# Patient Record
Sex: Male | Born: 1960 | Race: White | Hispanic: No | Marital: Married | Smoking: Never smoker
Health system: Southern US, Community
[De-identification: ages and names within clinical notes are randomized; demographics above are authoritative.]

## PROBLEM LIST (undated history)

## (undated) DIAGNOSIS — M810 Age-related osteoporosis without current pathological fracture: Secondary | ICD-10-CM

## (undated) DIAGNOSIS — L661 Lichen planopilaris, unspecified: Secondary | ICD-10-CM

## (undated) DIAGNOSIS — Q600 Renal agenesis, unilateral: Secondary | ICD-10-CM

## (undated) HISTORY — PX: KNEE ARTHROSCOPY: SUR90

## (undated) HISTORY — DX: Age-related osteoporosis without current pathological fracture: M81.0

## (undated) HISTORY — DX: Renal agenesis, unilateral: Q60.0

## (undated) HISTORY — PX: LIVER BIOPSY: SHX301

---

## 2005-04-01 ENCOUNTER — Ambulatory Visit (HOSPITAL_BASED_OUTPATIENT_CLINIC_OR_DEPARTMENT_OTHER): Admission: RE | Admit: 2005-04-01 | Discharge: 2005-04-01 | Payer: Self-pay | Admitting: Orthopedic Surgery

## 2007-01-31 ENCOUNTER — Ambulatory Visit (HOSPITAL_COMMUNITY): Admission: RE | Admit: 2007-01-31 | Discharge: 2007-02-01 | Payer: Self-pay

## 2007-01-31 ENCOUNTER — Encounter (INDEPENDENT_AMBULATORY_CARE_PROVIDER_SITE_OTHER): Payer: Self-pay | Admitting: Interventional Radiology

## 2010-06-04 NOTE — Op Note (Signed)
NAMEMASON, DIBIASIO            ACCOUNT NO.:  [id number]   MEDICAL RECORD NO.:  [id number]          PATIENT TYPE:  AMB   LOCATION:  DSC                          FACILITY:  [facility]   PHYSICIAN:  Harvie Junior, M.D.   DATE OF BIRTH:  Jun 10, 1960   DATE OF PROCEDURE:  04/01/2005  DATE OF DISCHARGE:  04/01/2005                                 OPERATIVE REPORT   PREOPERATIVE DIAGNOSIS:  Medial meniscal tear.   POSTOPERATIVE DIAGNOSIS:  1.  Medial meniscal tear.  2.  Chondromalacia patella.   PRINCIPAL PROCEDURES:  1.  Debridement of posterior horn medial meniscal tear.  2.  Debridement of chondromalacia patella.   SURGEON:  Harvie Junior, M.D.   ASSISTANT:  Marshia Ly, P.A.   ANESTHESIA:  General anesthesia.   BRIEF HISTORY:  Wahid Holley is a 50 year old male with a long history of  having had a left knee injury where he ultimately underwent arthroscopic  evaluation of his knee.  He had an excellent result from that and began  having symptoms in his right knee of pain.  We ultimately tried an injection  which helped for a short period of time but because of continued complaints  of pain, he was also taken to the operating room for operative knee  arthroscopy.   DESCRIPTION OF PROCEDURE:  Patient was taken to the operating room after  adequate anesthesia was obtained with general anesthetic, the patient was  placed supine on the operating table.  The right leg was prepped and draped  in usual sterile fashion.  Following this, routine arthroscopic examination  of the knee revealed that there was an obvious posterior horn medial  meniscal tear.  This was debrided with a combination of straight biting  forceps, up-biting forceps, right-hand side-biting forceps.  Remaining  meniscal remnants contoured down with suction shaver.  Attention was then  turned up to the patellofemoral joint where there was noted to be some grade  II chondromalacia which was debrided.  Following this,  attention was turned  into the notch where the ACL was normal.  Laterally, the lateral compartment  was normal.  The medial femoral condyle showed no significant evidence of  wear.  At this point,the knee was copiously irrigated and suctioned dry.  Final check was made for loose fragments and pieces,  seeing none.  The portals were closed with a bandage.  Sterile compressive  dressing was applied and the patient taken to the recovery room where she  was noted to be in satisfactory condition.   ESTIMATED BLOOD LOSS:  None.      Harvie Junior, M.D.  Electronically Signed     JLG/MEDQ  D:  05/12/2005  T:  05/13/2005  Job:  [id number]

## 2010-07-02 ENCOUNTER — Other Ambulatory Visit: Payer: Self-pay | Admitting: Gastroenterology

## 2010-10-07 LAB — CBC
HCT: 38.5 — ABNORMAL LOW
HCT: 40.5
Hemoglobin: 13.7
Hemoglobin: 14.2
MCHC: 35.1
MCV: 86.3
MCV: 86.4
RBC: 4.46
RBC: 4.69
WBC: 9.1
WBC: 9.3

## 2011-07-01 ENCOUNTER — Encounter (HOSPITAL_COMMUNITY): Payer: Self-pay

## 2011-07-01 ENCOUNTER — Emergency Department (HOSPITAL_COMMUNITY): Payer: BC Managed Care – PPO

## 2011-07-01 ENCOUNTER — Emergency Department (HOSPITAL_COMMUNITY)
Admission: EM | Admit: 2011-07-01 | Discharge: 2011-07-01 | Disposition: A | Discharge status: Home / Self Care | Payer: BC Managed Care – PPO | Attending: Emergency Medicine | Admitting: Emergency Medicine

## 2011-07-01 DIAGNOSIS — Q602 Renal agenesis, unspecified: Secondary | ICD-10-CM | POA: Insufficient documentation

## 2011-07-01 DIAGNOSIS — L439 Lichen planus, unspecified: Secondary | ICD-10-CM | POA: Insufficient documentation

## 2011-07-01 DIAGNOSIS — Q600 Renal agenesis, unilateral: Secondary | ICD-10-CM

## 2011-07-01 DIAGNOSIS — N281 Cyst of kidney, acquired: Secondary | ICD-10-CM | POA: Insufficient documentation

## 2011-07-01 DIAGNOSIS — Q605 Renal hypoplasia, unspecified: Secondary | ICD-10-CM | POA: Insufficient documentation

## 2011-07-01 DIAGNOSIS — Z79899 Other long term (current) drug therapy: Secondary | ICD-10-CM | POA: Insufficient documentation

## 2011-07-01 DIAGNOSIS — R109 Unspecified abdominal pain: Secondary | ICD-10-CM | POA: Insufficient documentation

## 2011-07-01 HISTORY — DX: Lichen planopilaris: L66.1

## 2011-07-01 HISTORY — DX: Lichen planopilaris, unspecified: L66.10

## 2011-07-01 LAB — POCT I-STAT, CHEM 8
Calcium, Ion: 1.15 mmol/L (ref 1.12–1.32)
Chloride: 103 mEq/L (ref 96–112)
Creatinine, Ser: 1 mg/dL (ref 0.50–1.35)
Glucose, Bld: 92 mg/dL (ref 70–99)
Hemoglobin: 13.9 g/dL (ref 13.0–17.0)
Potassium: 3.8 mEq/L (ref 3.5–5.1)

## 2011-07-01 LAB — DIFFERENTIAL
Basophils Relative: 1 % (ref 0–1)
Eosinophils Absolute: 0.1 10*3/uL (ref 0.0–0.7)
Lymphocytes Relative: 33 % (ref 12–46)
Lymphs Abs: 1.6 10*3/uL (ref 0.7–4.0)
Neutro Abs: 2.7 10*3/uL (ref 1.7–7.7)

## 2011-07-01 LAB — URINALYSIS, ROUTINE W REFLEX MICROSCOPIC
Bilirubin Urine: NEGATIVE
Glucose, UA: NEGATIVE mg/dL
Hgb urine dipstick: NEGATIVE
Ketones, ur: NEGATIVE mg/dL
Leukocytes, UA: NEGATIVE
pH: 6.5 (ref 5.0–8.0)

## 2011-07-01 LAB — CBC
HCT: 41.4 % (ref 39.0–52.0)
Hemoglobin: 14.1 g/dL (ref 13.0–17.0)
MCHC: 34.1 g/dL (ref 30.0–36.0)
MCV: 84.5 fL (ref 78.0–100.0)

## 2011-07-01 MED ORDER — ONDANSETRON 8 MG PO TBDP
8.0000 mg | ORAL_TABLET | Freq: Three times a day (TID) | ORAL | Status: AC | PRN
Start: 2011-07-01 — End: 2011-07-08

## 2011-07-01 MED ORDER — TRAMADOL HCL 50 MG PO TABS: 50.0000 mg | ORAL_TABLET | Freq: Four times a day (QID) | ORAL | Status: AC | PRN

## 2011-07-01 NOTE — ED Notes (Signed)
Patient reports that he has had intermittaent left flank pain since Wednesday and has gotten progressively worse. Patient denies dysuria, urinary frequency or hesitancy, blood in urine,  N/V, or fever.

## 2011-07-01 NOTE — ED Notes (Signed)
Pt c/o upper L flank pain, worse since yesterday. Unable to get comfortable. Movement makes pain worse. Denies n/v, dysuria, hematuria.

## 2011-07-01 NOTE — ED Provider Notes (Signed)
History     CSN: [id number]  Arrival date & time 07/01/11  1209   First MD Initiated Contact with Patient 07/01/11 1248      Chief Complaint  Patient presents with  . Flank Pain    (Consider location/radiation/quality/duration/timing/severity/associated sxs/prior treatment) Patient is a 51 y.o. male presenting with flank pain. The history is provided by the patient.  Flank Pain This is a new problem. Pertinent negatives include no chest pain, no abdominal pain, no headaches and no shortness of breath.   patient has had intermittent left flank pain since Wednesday. Been progressively worse. No dysuria. No frequency. No hesitancy no blood in urine. He states he developed some nausea since and the pain got severe last night. Not worse with movement. He stated that sometimes feel worse she sits wrong. No vomiting or diarrhea. No fevers. He's not had pains like this before. No rash.  Past Medical History  Diagnosis Date  . Lichen plano-pilaris     Past Surgical History  Procedure Date  . Knee arthroscopy   . Liver biopsy     Family History  Problem Relation Age of Onset  . Hypertension Mother     History  Substance Use Topics  . Smoking status: Never Smoker   . Smokeless tobacco: Never Used  . Alcohol Use: Yes     socially      Review of Systems  Constitutional: Negative for activity change and appetite change.  HENT: Negative for neck stiffness.   Eyes: Negative for pain.  Respiratory: Negative for chest tightness and shortness of breath.   Cardiovascular: Negative for chest pain and leg swelling.  Gastrointestinal: Negative for nausea, vomiting, abdominal pain and diarrhea.  Genitourinary: Positive for flank pain.  Musculoskeletal: Positive for back pain.  Skin: Negative for rash.  Neurological: Negative for weakness, numbness and headaches.  Psychiatric/Behavioral: Negative for behavioral problems.    Allergies  Review of patient's allergies indicates no  known allergies.  Home Medications   Current Outpatient Rx  Name Route Sig Dispense Refill  . IBUPROFEN 200 MG PO TABS Oral Take 400 mg by mouth every 6 (six) hours as needed. pain    . METHOTREXATE SODIUM 2.5 MG PO TABS Oral Take 15 mg by mouth once a week. Pt takes 6 tabs for 15mg  dose once weekly on Wednesday.    Marland Kitchen ONDANSETRON 8 MG PO TBDP Oral Take 1 tablet (8 mg total) by mouth every 8 (eight) hours as needed for nausea. 10 tablet 0  . TRAMADOL HCL 50 MG PO TABS Oral Take 1 tablet (50 mg total) by mouth every 6 (six) hours as needed for pain. 10 tablet 0    BP 121/79  Pulse 59  Temp 97.9 F (36.6 C) (Oral)  Resp 12  SpO2 100%  Physical Exam  Nursing note and vitals reviewed. Constitutional: He is oriented to person, place, and time. He appears well-developed and well-nourished.  HENT:  Head: Normocephalic and atraumatic.  Eyes: EOM are normal. Pupils are equal, round, and reactive to light.  Neck: Normal range of motion. Neck supple.  Cardiovascular: Normal rate, regular rhythm and normal heart sounds.   No murmur heard. Pulmonary/Chest: Effort normal and breath sounds normal.  Abdominal: Soft. Bowel sounds are normal. He exhibits no distension and no mass. There is no tenderness. There is no rebound and no guarding.  Genitourinary:       Tenderness to the left CVA area. No rash.  Musculoskeletal: Normal range of motion. He exhibits  no edema.  Neurological: He is alert and oriented to person, place, and time. No cranial nerve deficit.  Skin: Skin is warm and dry.  Psychiatric: He has a normal mood and affect.    ED Course  Procedures (including critical care time)   Labs Reviewed  URINALYSIS, ROUTINE W REFLEX MICROSCOPIC  CBC  DIFFERENTIAL  POCT I-STAT, CHEM 8   Ct Abdomen Pelvis Wo Contrast  07/01/2011  *RADIOLOGY REPORT*  Clinical Data: Left flank pain with nausea for 2 days. No reported prior relevant surgery.  CT ABDOMEN AND PELVIS WITHOUT CONTRAST   Technique:  Multidetector CT imaging of the abdomen and pelvis was performed following the standard protocol without intravenous contrast.  Comparison: None.  Findings: The lung bases are clear.  There is no pleural effusion. No right kidney is demonstrated in its expected anatomic location or in the pelvis.  The left kidney demonstrates probable compensatory hypertrophy, measuring approximately 14.5 cm in length.  There is a 6.5 x 8.7 cm cyst in the upper pole of the left kidney which does not show any suspicious features on noncontrast imaging.  There is no hydronephrosis or perinephric soft tissue stranding.  Mild prominence of the left renal pelvis and ureter is noted.  There is no evidence of renal, ureteral or bladder calculus.  There is a 1.6 cm low density lesion posteriorly in the right hepatic lobe on image number 19.  The liver otherwise appears normal.  The spleen, gallbladder and pancreas appear normal.  Both adrenal glands are visualized and appear normal.  The bowel gas pattern appears normal.  There is stool throughout the colon. No inflammatory changes are seen.  There is a small right pelvic phlebolith.  The prostate gland and seminal vesicles appear normal.  There is a 2.7 cm fluid collection anterior to the right femoral head consistent with iliopsoas bursitis.  No acute osseous findings are seen.  IMPRESSION:  1.  No evidence of urinary tract calculus or hydronephrosis. 2.  Solitary left kidney demonstrates compensatory hypertrophy and a large cyst in its upper pole.  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 Both adrenal glands are present. 3.  Small indeterminate liver lesion. 4.  Right iliopsoas bursitis.  Original Report Authenticated By: Gerrianne Scale, M.D.     1. Renal cyst   2. Solitary kidney, congenital       MDM  Patient with left flank pain. No urinary symptoms. No trauma. Lab work and urine reassuring. Noncontrast CT was  done and showed a solitary kidney with a cyst. Patient was discharged home with pain medications and urology followup.        Juliet Rude. Rubin Payor, MD 07/01/11 [phone]

## 2011-07-01 NOTE — Discharge Instructions (Signed)
You have only 1 kidney and it has a cyst on it. This is the likely cause of your pain. Watch for fevers or change in urinary output. Follow with Urology.

## 2011-07-03 ENCOUNTER — Encounter (HOSPITAL_COMMUNITY): Payer: Self-pay | Admitting: Emergency Medicine

## 2011-07-03 ENCOUNTER — Emergency Department (HOSPITAL_COMMUNITY)
Admission: EM | Admit: 2011-07-03 | Discharge: 2011-07-03 | Disposition: A | Discharge status: Home / Self Care | Payer: BC Managed Care – PPO | Attending: Emergency Medicine | Admitting: Emergency Medicine

## 2011-07-03 DIAGNOSIS — B029 Zoster without complications: Secondary | ICD-10-CM | POA: Insufficient documentation

## 2011-07-03 DIAGNOSIS — R110 Nausea: Secondary | ICD-10-CM | POA: Insufficient documentation

## 2011-07-03 DIAGNOSIS — Q619 Cystic kidney disease, unspecified: Secondary | ICD-10-CM | POA: Insufficient documentation

## 2011-07-03 MED ORDER — PREDNISONE 20 MG PO TABS
60.0000 mg | ORAL_TABLET | Freq: Once | ORAL | Status: AC
  Administered 2011-07-03: 60 mg via ORAL
  Filled 2011-07-03: qty 3

## 2011-07-03 MED ORDER — OXYCODONE-ACETAMINOPHEN 5-325 MG PO TABS: 1.0000 | ORAL_TABLET | Freq: Four times a day (QID) | ORAL | Status: AC | PRN

## 2011-07-03 MED ORDER — ONDANSETRON 8 MG PO TBDP
8.0000 mg | ORAL_TABLET | Freq: Once | ORAL | Status: AC
  Administered 2011-07-03: 8 mg via ORAL
  Filled 2011-07-03: qty 2
  Filled 2011-07-03: qty 1

## 2011-07-03 MED ORDER — ONDANSETRON HCL 8 MG PO TABS: 8.0000 mg | ORAL_TABLET | Freq: Three times a day (TID) | ORAL | Status: AC | PRN

## 2011-07-03 MED ORDER — PREDNISONE 20 MG PO TABS: ORAL_TABLET | ORAL | Status: AC

## 2011-07-03 MED ORDER — OXYCODONE-ACETAMINOPHEN 5-325 MG PO TABS
2.0000 | ORAL_TABLET | Freq: Once | ORAL | Status: AC
  Administered 2011-07-03: 2 via ORAL
  Filled 2011-07-03: qty 2

## 2011-07-03 MED ORDER — ACYCLOVIR 800 MG PO TABS: 800.0000 mg | ORAL_TABLET | Freq: Every day | ORAL | Status: AC

## 2011-07-03 NOTE — ED Notes (Signed)
Spouse and patient voiced understanding of instructions given

## 2011-07-03 NOTE — Discharge Instructions (Signed)
Take prednisone and acyclovir as prescribed. You may take percocet as need for pain. No driving for the next 6 hours or when taking percocet. Also, do not take tylenol or acetaminophen containing medication when taking percocet.  You may take zofran prior to taking percocet to help prevent nausea if you find that the percocet causes nausea.  Return to ER if worse, new symptoms, other concern.  For recent finding of kidney cyst, follow up with urologist as planned.        Shingles Shingles is caused by the same virus that causes chickenpox (varicella zoster virus or VZV). Shingles often occurs many years or decades after having chickenpox. That is why it is more common in adults older than 50 years. The virus reactivates and breaks out as an infection in a nerve root. SYMPTOMS   The initial feeling (sensations) may be pain. This pain is usually described as:   Burning.   Stabbing.   Throbbing.   Tingling in the nerve root.   A red rash will follow in a couple days. The rash may occur in any area of the body and is usually on one side (unilateral) of the body in a band or belt-like pattern. The rash usually starts out as very small blisters (vesicles). They will dry up after 7 to 10 days. This is not usually a significant problem except for the pain it causes.   Long-lasting (chronic) pain is more likely in an elderly person. It can last months to years. This condition is called postherpetic neuralgia.  Shingles can be an extremely severe infection in someone with AIDS, a weakened immune system, or with forms of leukemia. It can also be severe if you are taking transplant medicines or other medicines that weaken the immune system. TREATMENT  Your caregiver will often treat you with:  Antiviral drugs.   Anti-inflammatory drugs.   Pain medicines.  Bed rest is very important in preventing the pain associated with herpes zoster (postherpetic neuralgia). Application of heat in the form  of a hot water bottle or electric heating pad or gentle pressure with the hand is recommended to help with the pain or discomfort. PREVENTION  A varicella zoster vaccine is available to help protect against the virus. The Food and Drug Administration approved the varicella zoster vaccine for individuals 46 years of age and older. HOME CARE INSTRUCTIONS   Cool compresses to the area of rash may be helpful.   Only take over-the-counter or prescription medicines for pain, discomfort, or fever as directed by your caregiver.   Avoid contact with:   Babies.   Pregnant women.   Children with eczema.   Elderly people with transplants.   People with chronic illnesses, such as leukemia and AIDS.   If the area involved is on your face, you may receive a referral for follow-up to a specialist. It is very important to keep all follow-up appointments. This will help avoid eye complications, chronic pain, or disability.  SEEK IMMEDIATE MEDICAL CARE IF:   You develop any pain (headache) in the area of the face or eye. This must be followed carefully by your caregiver or ophthalmologist. An infection in part of your eye (cornea) can be very serious. It could lead to blindness.   You do not have pain relief from prescribed medicines.   Your redness or swelling spreads.   The area involved becomes very swollen and painful.   You have a fever.   You notice any red or painful  lines extending away from the affected area toward your heart (lymphangitis).   Your condition is worsening or has changed.  Document Released: 01/03/2005 Document Revised: 12/23/2010 Document Reviewed: 12/08/2008 [facility] Patient Information 2012 [place], [place].

## 2011-07-03 NOTE — ED Provider Notes (Signed)
History     CSN: [id number]  Arrival date & time 07/03/11  4098   First MD Initiated Contact with Patient 07/03/11 [phone]      Chief Complaint  Patient presents with  . Flank Pain    (Consider location/radiation/quality/duration/timing/severity/associated sxs/prior treatment) Patient is a 51 y.o. male presenting with flank pain. The history is provided by the patient and the spouse.  Flank Pain  pt c/o pain to left flank for past few days. Constant, dull/burning. Non radiating. No specific exacerbating or alleviating factors. Nausea. No vomiting. No gu c/o. No hematuria. No fever or chills. Was recently seen in ed for same, had ct, was told has renal cyst.   Past Medical History  Diagnosis Date  . Lichen plano-pilaris     Past Surgical History  Procedure Date  . Knee arthroscopy   . Liver biopsy     Family History  Problem Relation Age of Onset  . Hypertension Mother     History  Substance Use Topics  . Smoking status: Never Smoker   . Smokeless tobacco: Never Used  . Alcohol Use: Yes     socially      Review of Systems  Constitutional: Negative for fever.  Gastrointestinal: Positive for nausea.  Genitourinary: Positive for flank pain. Negative for dysuria and hematuria.    Allergies  Review of patient's allergies indicates no known allergies.  Home Medications   Current Outpatient Rx  Name Route Sig Dispense Refill  . IBUPROFEN 200 MG PO TABS Oral Take 400 mg by mouth every 6 (six) hours as needed. pain    . METHOTREXATE SODIUM 2.5 MG PO TABS Oral Take 15 mg by mouth once a week. Pt takes 6 tabs for 15mg  dose once weekly on Wednesday.    Marland Kitchen ONDANSETRON 8 MG PO TBDP Oral Take 1 tablet (8 mg total) by mouth every 8 (eight) hours as needed for nausea. 10 tablet 0  . TRAMADOL HCL 50 MG PO TABS Oral Take 1 tablet (50 mg total) by mouth every 6 (six) hours as needed for pain. 10 tablet 0    BP 118/79  Pulse 63  Temp 98.2 F (36.8 C) (Oral)  Resp 18  SpO2  94%  Physical Exam  Nursing note and vitals reviewed. Constitutional: He is oriented to person, place, and time. He appears well-developed and well-nourished. No distress.  HENT:  Head: Atraumatic.  Eyes: Conjunctivae are normal.  Neck: Neck supple. No tracheal deviation present.  Cardiovascular: Normal rate.   Pulmonary/Chest: Effort normal. No accessory muscle usage. No respiratory distress.  Abdominal: Soft. Bowel sounds are normal. He exhibits no distension. There is no tenderness.  Genitourinary:       No cva tenderness  Musculoskeletal: He exhibits no edema.  Neurological: He is alert and oriented to person, place, and time.  Skin: Skin is warm and dry. Rash noted.       Pt with erythematous vesicular rash following single dermatome left flank in area of his pain. No cellulitis.   Psychiatric: He has a normal mood and affect.    ED Course  Procedures (including critical care time)  Labs Reviewed - No data to display Ct Abdomen Pelvis Wo Contrast  07/01/2011  *RADIOLOGY REPORT*  Clinical Data: Left flank pain with nausea for 2 days. No reported prior relevant surgery.  CT ABDOMEN AND PELVIS WITHOUT CONTRAST  Technique:  Multidetector CT imaging of the abdomen and pelvis was performed following the standard protocol without intravenous contrast.  Comparison:  None.  Findings: The lung bases are clear.  There is no pleural effusion. No right kidney is demonstrated in its expected anatomic location or in the pelvis.  The left kidney demonstrates probable compensatory hypertrophy, measuring approximately 14.5 cm in length.  There is a 6.5 x 8.7 cm cyst in the upper pole of the left kidney which does not show any suspicious features on noncontrast imaging.  There is no hydronephrosis or perinephric soft tissue stranding.  Mild prominence of the left renal pelvis and ureter is noted.  There is no evidence of renal, ureteral or bladder calculus.  There is a 1.6 cm low density lesion  posteriorly in the right hepatic lobe on image number 19.  The liver otherwise appears normal.  The spleen, gallbladder and pancreas appear normal.  Both adrenal glands are visualized and appear normal.  The bowel gas pattern appears normal.  There is stool throughout the colon. No inflammatory changes are seen.  There is a small right pelvic phlebolith.  The prostate gland and seminal vesicles appear normal.  There is a 2.7 cm fluid collection anterior to the right femoral head consistent with iliopsoas bursitis.  No acute osseous findings are seen.  IMPRESSION:  1.  No evidence of urinary tract calculus or hydronephrosis. 2.  Solitary left kidney demonstrates compensatory hypertrophy and a large cyst in its upper pole.  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 Both adrenal glands are present. 3.  Small indeterminate liver lesion. 4.  Right iliopsoas bursitis.  Original Report Authenticated By: Gerrianne Scale, M.D.        MDM  Pt has ride, does not have to drive.  Percocet po. zofran po (pt notes nausea w pain med in past).   Discussed dx/shingles w pt.  Feel likely that recent dx renal cyst was an incidental finding unrelated to pts acute pain - for that issue pt has been referred to urology follow up.         Suzi Roots, MD 07/03/11 [phone]

## 2011-07-03 NOTE — ED Notes (Signed)
Has noticed a rash that starts posterior midline and moves laterally to the left flank to his abdomen. Patient states that his pain is consistently there in that area and is more painful to the touch. Spouse stated that she noticed the rash when patient was getting undressed here in the ER this am

## 2011-07-03 NOTE — ED Notes (Signed)
Pt c/o left flank pain. States he was seen here on Thursday and dx with cyst on kidney. Pt has appt tomorrow but was unable to wait until then d/t pain.

## 2014-10-07 ENCOUNTER — Other Ambulatory Visit: Payer: Self-pay | Admitting: Surgery

## 2015-05-08 ENCOUNTER — Encounter (HOSPITAL_COMMUNITY): Payer: Self-pay | Admitting: Emergency Medicine

## 2015-05-08 ENCOUNTER — Emergency Department (HOSPITAL_COMMUNITY): Payer: BLUE CROSS/BLUE SHIELD

## 2015-05-08 ENCOUNTER — Emergency Department (HOSPITAL_COMMUNITY)
Admission: EM | Admit: 2015-05-08 | Discharge: 2015-05-08 | Disposition: A | Discharge status: Home / Self Care | Payer: BLUE CROSS/BLUE SHIELD | Attending: Emergency Medicine | Admitting: Emergency Medicine

## 2015-05-08 DIAGNOSIS — Z79899 Other long term (current) drug therapy: Secondary | ICD-10-CM | POA: Insufficient documentation

## 2015-05-08 DIAGNOSIS — Y92009 Unspecified place in unspecified non-institutional (private) residence as the place of occurrence of the external cause: Secondary | ICD-10-CM | POA: Insufficient documentation

## 2015-05-08 DIAGNOSIS — Y9301 Activity, walking, marching and hiking: Secondary | ICD-10-CM | POA: Diagnosis not present

## 2015-05-08 DIAGNOSIS — W2203XA Walked into furniture, initial encounter: Secondary | ICD-10-CM | POA: Insufficient documentation

## 2015-05-08 DIAGNOSIS — Y998 Other external cause status: Secondary | ICD-10-CM | POA: Insufficient documentation

## 2015-05-08 DIAGNOSIS — Z872 Personal history of diseases of the skin and subcutaneous tissue: Secondary | ICD-10-CM | POA: Diagnosis not present

## 2015-05-08 DIAGNOSIS — S99922A Unspecified injury of left foot, initial encounter: Secondary | ICD-10-CM | POA: Diagnosis present

## 2015-05-08 DIAGNOSIS — S92512A Displaced fracture of proximal phalanx of left lesser toe(s), initial encounter for closed fracture: Secondary | ICD-10-CM | POA: Insufficient documentation

## 2015-05-08 DIAGNOSIS — S92912A Unspecified fracture of left toe(s), initial encounter for closed fracture: Secondary | ICD-10-CM

## 2015-05-08 MED ORDER — NAPROXEN 500 MG PO TABS: ORAL_TABLET | ORAL | Status: DC

## 2015-05-08 MED ORDER — LIDOCAINE HCL 2 % IJ SOLN
5.0000 mL | Freq: Once | INTRAMUSCULAR | Status: DC
  Filled 2015-05-08: qty 20

## 2015-05-08 MED ORDER — HYDROCODONE-ACETAMINOPHEN 5-325 MG PO TABS: 1.0000 | ORAL_TABLET | ORAL | Status: DC | PRN

## 2015-05-08 NOTE — ED Notes (Signed)
Pt states that he was coming out of the bathroom and caught the chair with his left 5th toe; pt c/o pain to left 5th toe and foot; mild swelling noted to bottom of left foot

## 2015-05-08 NOTE — Discharge Instructions (Signed)
Toe Fracture °A toe fracture is a break in one of the toe bones (phalanges). °CAUSES °This condition may be caused by: °· Dropping a heavy object on your toe. °· Stubbing your toe. °· Overusing your toe or doing repetitive exercise. °· Twisting or stretching your toe out of place. °RISK FACTORS °This condition is more likely to develop in people who: °· Play contact sports. °· Have a bone disease. °· Have a low calcium level. °SYMPTOMS °The main symptoms of this condition are swelling and pain in the toe. The pain may get worse with standing or walking. Other symptoms include: °· Bruising. °· Stiffness. °· Numbness. °· A change in the way the toe looks. °· Broken bones that poke through the skin. °· Blood beneath the toenail. °DIAGNOSIS °This condition is diagnosed with a physical exam. You may also have X-rays. °TREATMENT  °Treatment for this condition depends on the type of fracture and its severity. Treatment may involve: °· Taping the broken toe to a toe that is next to it (buddy taping). This is the most common treatment for fractures in which the bone has not moved out of place (nondisplaced fracture). °· Wearing a shoe that has a wide, rigid sole to protect the toe and to limit its movement. °· Wearing a walking cast. °· Having a procedure to move the toe back into place. °· Surgery. This may be needed: °¨ If there are many pieces of broken bone that are out of place (displaced). °¨ If the toe joint breaks. °¨ If the bone breaks through the skin. °· Physical therapy. This is done to help regain movement and strength in the toe. °You may need follow-up X-rays to make sure that the bone is healing well and staying in position. °HOME CARE INSTRUCTIONS °If You Have a Cast: °· Do not stick anything inside the cast to scratch your skin. Doing that increases your risk of infection. °· Check the skin around the cast every day. Report any concerns to your health care provider. You may put lotion on dry skin around the  edges of the cast. Do not apply lotion to the skin underneath the cast. °· Do not put pressure on any part of the cast until it is fully hardened. This may take several hours. °· Keep the cast clean and dry. °Bathing °· Do not take baths, swim, or use a hot tub until your health care provider approves. Ask your health care provider if you can take showers. You may only be allowed to take sponge baths for bathing. °· If your health care provider approves bathing and showering, cover the cast or bandage (dressing) with a watertight plastic bag to protect it from water. Do not let the cast or dressing get wet. °Managing Pain, Stiffness, and Swelling °· If you do not have a cast, apply ice to the injured area, if directed. °¨ Put ice in a plastic bag. °¨ Place a towel between your skin and the bag. °¨ Leave the ice on for 20 minutes, 2-3 times per day. °· Move your toes often to avoid stiffness and to lessen swelling. °· Raise (elevate) the injured area above the level of your heart while you are sitting or lying down. °Driving °· Do not drive or operate heavy machinery while taking pain medicine. °· Do not drive while wearing a cast on a foot that you use for driving. °Activity °· Return to your normal activities as directed by your health care provider. Ask your health care   provider what activities are safe for you. °· Perform exercises daily as directed by your health care provider or physical therapist. °Safety °· Do not use the injured limb to support your body weight until your health care provider says that you can. Use crutches or other assistive devices as directed by your health care provider. °General Instructions °· If your toe was treated with buddy taping, follow your health care provider's instructions for changing the gauze and tape. Change it more often: °¨ The gauze and tape get wet. If this happens, dry the space between the toes. °¨ The gauze and tape are too tight and cause your toe to become pale  or numb. °· Wear a protective shoe as directed by your health care provider. If you were not given a protective shoe, wear sturdy, supportive shoes. Your shoes should not pinch your toes and should not fit tightly against your toes. °· Do not use any tobacco products, including cigarettes, chewing tobacco, or e-cigarettes. Tobacco can delay bone healing. If you need help quitting, ask your health care provider. °· Take medicines only as directed by your health care provider. °· Keep all follow-up visits as directed by your health care provider. This is important. °SEEK MEDICAL CARE IF: °· You have a fever. °· Your pain medicine is not helping. °· Your toe is cold. °· Your toe is numb. °· You still have pain after one week of rest and treatment. °· You still have pain after your health care provider has said that you can start walking again. °· You have pain, tingling, or numbness in your foot that is not going away. °SEEK IMMEDIATE MEDICAL CARE IF: °· You have severe pain. °· You have redness or inflammation in your toe that is getting worse. °· You have pain or numbness in your toe that is getting worse. °· Your toe turns blue. °  °This information is not intended to replace advice given to you by your health care provider. Make sure you discuss any questions you have with your health care provider. °  °Document Released: 01/01/2000 Document Revised: 09/24/2014 Document Reviewed: 10/30/2013 °Elsevier Interactive Patient Education ©2016 [employer] ° °

## 2015-05-08 NOTE — ED Notes (Signed)
Nerve block to left 5th toe by Elpidio AnisShari Upstill, PA; toe positioned back in place and buddy wrapped to 4th toe; Cam Walker placed

## 2015-05-08 NOTE — ED Provider Notes (Signed)
CSN: [id number]     Arrival date & time 05/08/15  [id number] History   First MD Initiated Contact with Patient 05/08/15 0355     Chief Complaint  Patient presents with  . Foot Injury     (Consider location/radiation/quality/duration/timing/severity/associated sxs/prior Treatment) Patient is a 55 y.o. male presenting with foot injury. The history is provided by the patient. No language interpreter was used.  Foot Injury Location:  Foot Injury: yes   Foot location:  L foot Chronicity:  New Dislocation: yes   Foreign body present:  No foreign bodies Prior injury to area:  Yes Associated symptoms comment:  Patient presents with left foot pain and 5th toe deformity after running into furniture while walking through the house tonight. No other injury.    Past Medical History  Diagnosis Date  . Lichen plano-pilaris    Past Surgical History  Procedure Laterality Date  . Knee arthroscopy    . Liver biopsy     Family History  Problem Relation Age of Onset  . Hypertension Mother    Social History  Substance Use Topics  . Smoking status: Never Smoker   . Smokeless tobacco: Never Used  . Alcohol Use: Yes     Comment: socially    Review of Systems  Musculoskeletal:       See HPI.  Skin: Negative for wound.  Neurological: Negative for numbness.      Allergies  Review of patient's allergies indicates no known allergies.  Home Medications   Prior to Admission medications   Medication Sig Start Date End Date Taking? Authorizing Provider  folic acid (FOLVITE) 1 MG tablet Take 1 mg by mouth daily. 04/07/15  Yes Historical Provider, MD  ibuprofen (ADVIL,MOTRIN) 200 MG tablet Take 400 mg by mouth every 6 (six) hours as needed. pain   Yes Historical Provider, MD  methotrexate 2.5 MG tablet Take 15 mg by mouth once a week. Pt takes 6 tabs for 15mg  dose once weekly on Wednesday.   Yes Historical Provider, MD   BP 117/88 mmHg  Pulse 74  Temp(Src) 97.6 F (36.4 C) (Oral)  Resp 20   Ht 6\' 1"  (1.854 m)  Wt 77.111 kg  BMI 22.43 kg/m2  SpO2 99% Physical Exam  Constitutional: He appears well-developed and well-nourished. No distress.  Pulmonary/Chest: Effort normal.  Musculoskeletal:  Left 5th toe lateral deviation/deformity. Minimal swelling. No discoloration. No MT tenderness.   Neurological: He is alert. He has normal reflexes.  Skin: Skin is warm and dry.    ED Course  Procedures (including critical care time) Labs Review Labs Reviewed - No data to display  Imaging Review Dg Foot Complete Left  05/08/2015  CLINICAL DATA:  Pain and deformity to the fifth toe after injury. EXAM: LEFT FOOT - COMPLETE 3+ VIEW COMPARISON:  None. FINDINGS: Transverse fracture of the proximal aspect proximal phalanx left fifth toe with lateral angulation of the distal fracture fragment. Mild impaction. Fracture line does not appear to extend to the articular surface. Hammertoe deformity of the fourth toe. IMPRESSION: Fracture of the proximal phalanx left fifth toe. Electronically Signed   By: Burman NievesWilliam  Stevens M.D.   On: 05/08/2015 04:09   I have personally reviewed and evaluated these images and lab results as part of my medical decision-making.   EKG Interpretation None     PROCEDURE: Left 5th toe numbed via digital block using 2% lidocaine w/o epi with good anesthesia. Toe reduced by traction, realignment, to anatomical position. Toe buddy taped to 4th  toe for stabilization. MDM   Final diagnoses:  None    1. Left 5th toe fracture.  Left 5th toe fraction dislocation, reduced to anatomical alignment. Stable for discharge home.   Elpidio Anis, PA-C 05/08/15 0443  Dione Booze, MD 05/08/15 [phone]

## 2015-05-08 NOTE — ED Notes (Signed)
Pt states he was walking in the dark and kicked into a piece of furniture with his left foot  Pt has deformity noted to his 4th and 5th toe

## 2018-02-01 ENCOUNTER — Other Ambulatory Visit: Payer: Self-pay | Admitting: Internal Medicine

## 2018-02-01 DIAGNOSIS — Z7952 Long term (current) use of systemic steroids: Secondary | ICD-10-CM

## 2018-03-29 ENCOUNTER — Other Ambulatory Visit: Payer: BLUE CROSS/BLUE SHIELD

## 2018-11-06 ENCOUNTER — Other Ambulatory Visit: Payer: Self-pay

## 2018-11-06 DIAGNOSIS — Z20822 Contact with and (suspected) exposure to covid-19: Secondary | ICD-10-CM

## 2018-11-07 LAB — NOVEL CORONAVIRUS, NAA: SARS-CoV-2, NAA: NOT DETECTED

## 2019-01-22 ENCOUNTER — Ambulatory Visit
Admission: RE | Admit: 2019-01-22 | Discharge: 2019-01-22 | Disposition: A | Discharge status: Home / Self Care | Payer: BC Managed Care – PPO | Source: Ambulatory Visit | Attending: Internal Medicine | Admitting: Internal Medicine

## 2019-01-22 ENCOUNTER — Other Ambulatory Visit: Payer: Self-pay

## 2019-01-22 DIAGNOSIS — Z7952 Long term (current) use of systemic steroids: Secondary | ICD-10-CM

## 2019-02-07 ENCOUNTER — Ambulatory Visit: Payer: BC Managed Care – PPO | Attending: Internal Medicine

## 2019-02-07 ENCOUNTER — Other Ambulatory Visit: Payer: BC Managed Care – PPO

## 2019-02-07 DIAGNOSIS — Z20822 Contact with and (suspected) exposure to covid-19: Secondary | ICD-10-CM

## 2019-02-08 LAB — NOVEL CORONAVIRUS, NAA: SARS-CoV-2, NAA: NOT DETECTED

## 2020-08-26 ENCOUNTER — Other Ambulatory Visit: Payer: Self-pay

## 2020-08-26 ENCOUNTER — Other Ambulatory Visit: Payer: Self-pay | Admitting: Physician Assistant

## 2020-08-26 ENCOUNTER — Ambulatory Visit
Admission: RE | Admit: 2020-08-26 | Discharge: 2020-08-26 | Disposition: A | Discharge status: Home / Self Care | Payer: BC Managed Care – PPO | Source: Ambulatory Visit | Attending: Physician Assistant | Admitting: Physician Assistant

## 2020-08-26 DIAGNOSIS — M545 Low back pain, unspecified: Secondary | ICD-10-CM

## 2021-07-19 ENCOUNTER — Other Ambulatory Visit: Payer: Self-pay | Admitting: Internal Medicine

## 2021-07-19 DIAGNOSIS — M8588 Other specified disorders of bone density and structure, other site: Secondary | ICD-10-CM

## 2021-07-22 ENCOUNTER — Ambulatory Visit
Admission: RE | Admit: 2021-07-22 | Discharge: 2021-07-22 | Disposition: A | Discharge status: Home / Self Care | Payer: Managed Care, Other (non HMO) | Source: Ambulatory Visit | Attending: Internal Medicine | Admitting: Internal Medicine

## 2021-07-22 ENCOUNTER — Other Ambulatory Visit: Payer: Self-pay | Admitting: Internal Medicine

## 2021-07-22 DIAGNOSIS — R079 Chest pain, unspecified: Secondary | ICD-10-CM

## 2021-12-20 ENCOUNTER — Other Ambulatory Visit: Payer: Self-pay | Admitting: Internal Medicine

## 2021-12-20 DIAGNOSIS — R0789 Other chest pain: Secondary | ICD-10-CM

## 2022-01-18 ENCOUNTER — Ambulatory Visit
Admission: RE | Admit: 2022-01-18 | Discharge: 2022-01-18 | Disposition: A | Discharge status: Home / Self Care | Payer: Managed Care, Other (non HMO) | Source: Ambulatory Visit | Attending: Internal Medicine | Admitting: Internal Medicine

## 2022-01-18 DIAGNOSIS — R0789 Other chest pain: Secondary | ICD-10-CM

## 2022-01-18 MED ORDER — IOPAMIDOL (ISOVUE-300) INJECTION 61%
75.0000 mL | Freq: Once | INTRAVENOUS | Status: AC | PRN
  Administered 2022-01-18: 75 mL via INTRAVENOUS

## 2022-01-21 ENCOUNTER — Ambulatory Visit
Admission: RE | Admit: 2022-01-21 | Discharge: 2022-01-21 | Disposition: A | Discharge status: Home / Self Care | Payer: Managed Care, Other (non HMO) | Source: Ambulatory Visit | Attending: Internal Medicine | Admitting: Internal Medicine

## 2022-01-21 DIAGNOSIS — M8588 Other specified disorders of bone density and structure, other site: Secondary | ICD-10-CM

## 2022-02-04 ENCOUNTER — Other Ambulatory Visit: Payer: Managed Care, Other (non HMO)

## 2022-02-07 NOTE — Progress Notes (Signed)
Brady Bear, MD Reason for referral-chest pain  HPI: 62 year old male for evaluation of chest pain at request of Lavone Orn, MD.  Patient apparently has a long history of chest wall pain.  He was seen at [facility] for this in the past.  He was treated for costochondritis but his symptoms worsened.  Patient apparently was referred to cardiothoracic surgery but not felt to have a surgical issue.  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  Patient states that beginning April 2023 he has had intermittent chest pain.  His initial symptoms began after his wife landed on top of him.  He had pain for approximately 3 to 4 days in the substernal area without radiation or associated symptoms.  The pain is not pleuritic or positional.  It is not related to food.  Since that time it has been intermittent.  Typically exacerbated by being struck in the chest.  Note if he is not having no symptoms he denies exertional chest pain.  He denies dyspnea on exertion, orthopnea, PND, pedal edema or syncope.  Cardiology now asked to evaluate.  Current Outpatient Medications  Medication Sig Dispense Refill   folic acid (FOLVITE) 1 MG tablet Take 1 mg by mouth daily.  11   ibuprofen (ADVIL,MOTRIN) 200 MG tablet Take 400 mg by mouth every 6 (six) hours as needed. pain     methotrexate 2.5 MG tablet Take 15 mg by mouth once a week. Pt takes 6 tabs for 15mg  dose once weekly on Wednesday.     omeprazole (PRILOSEC) 40 MG capsule Take 40 mg by mouth every morning.     traMADol (ULTRAM) 50 MG tablet Take 50 mg  by mouth 2 (two) times daily as needed.     No current facility-administered medications for this visit.    No Known Allergies   Past Medical History:  Diagnosis Date   Congenital solitary kidney    Lichen plano-pilaris     Past Surgical History:  Procedure Laterality Date   KNEE ARTHROSCOPY     LIVER BIOPSY      Social History   Socioeconomic History   Marital status: Married    Spouse name: Not on file   Number of children: Not on file   Years of education: Not on file   Highest education level: Not on file  Occupational History   Not on file  Tobacco Use   Smoking status: Never   Smokeless tobacco: Never  Substance and Sexual Activity   Alcohol use: Yes    Comment: socially   Drug use: No   Sexual activity: Not on file  Other Topics Concern   Not on file  Social History Narrative   Not on file   Social Determinants of Health   Financial Resource Strain: Not on file  Food Insecurity: Not on file  Transportation Needs: Not on file  Physical Activity: Not on file  Stress: Not on file  Social Connections: Not on file  Intimate Partner Violence: Not on file    Family History  Problem Relation Age of Onset   Hypertension Mother    Dementia Mother  Hypertension Father     ROS: no fevers or chills, productive cough, hemoptysis, dysphasia, odynophagia, melena, hematochezia, dysuria, hematuria, rash, seizure activity, orthopnea, PND, pedal edema, claudication. Remaining systems are negative.  Physical Exam:   Blood pressure 110/74, pulse 64, height 6\' 1"  (1.854 m), weight 180 lb (81.6 kg), SpO2 98 %.  General:  Well developed/well nourished in NAD Skin warm/dry Patient not depressed No peripheral clubbing Back-normal HEENT-normal/normal eyelids Neck supple/normal carotid upstroke bilaterally; no bruits; no JVD; no thyromegaly chest - CTA/ normal expansion CV - RRR/normal S1 and S2; no murmurs, rubs or gallops;  PMI nondisplaced Abdomen -NT/ND, no  HSM, no mass, + bowel sounds, no bruit 2+ femoral pulses, no bruits Ext-no edema, chords, 2+ DP Neuro-grossly nonfocal  ECG -normal sinus rhythm at a rate of 64, no ST changes.  Personally reviewed  A/P  1 chest pain-symptoms are atypical.  Electrocardiogram shows no ST changes.  Likely musculoskeletal.  However they are recurrent and longstanding and there was some concern this could be underlying coronary disease.  I will arrange a cardiac CTA to more fully assess.  2 lung nodule-patient will need follow-up noncontrast chest CT 3 to 6 months.  He will follow-up with primary care for this issue.  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  Question need for vascular input.  Kirk Ruths, MD

## 2022-02-21 ENCOUNTER — Encounter: Payer: Self-pay | Admitting: Cardiology

## 2022-02-21 ENCOUNTER — Ambulatory Visit: Payer: Managed Care, Other (non HMO) | Attending: Cardiology | Admitting: Cardiology

## 2022-02-21 VITALS — BP 110/74 | HR 64 | Ht 73.0 in | Wt 180.0 lb

## 2022-02-21 DIAGNOSIS — R072 Precordial pain: Secondary | ICD-10-CM

## 2022-02-21 MED ORDER — METOPROLOL TARTRATE 50 MG PO TABS: ORAL_TABLET | ORAL | 0 refills | Status: DC

## 2022-02-21 NOTE — Patient Instructions (Signed)
Testing/Procedures:    Your cardiac CT will be scheduled at   [facility] [place], [place] [address] [phone]    If scheduled at [facility], please arrive at the [facility] (Entrance C2) of [facility] 30 minutes prior to test start time. You can use the FREE valet parking offered at entrance C (encouraged to control the heart rate for the test)  Proceed to the [facility] Radiology Department (first floor) to check-in and test prep.  All radiology patients and guests should use entrance C2 at [facility], accessed from [facility], even though the hospital's physical address listed is [address].    If scheduled at [facility] or [facility], please arrive 15 mins early for check-in and test prep.   Please follow these instructions carefully (unless otherwise directed):  Hold all erectile dysfunction medications at least 3 days (72 hrs) prior to test. (Ie viagra, cialis, sildenafil, tadalafil, etc) We will administer nitroglycerin during this exam.   On the Night Before the Test: Be sure to Drink plenty of water. Do not consume any caffeinated/decaffeinated beverages or chocolate 12 hours prior to your test. Do not take any antihistamines 12 hours prior to your test.   On the Day of the Test: Drink plenty of water until 1 hour prior to the test. Do not eat any food 1 hour prior to test. You may take your regular medications prior to the test.  Take metoprolol (Lopressor) 50 mg two hours prior to test.   After the Test: Drink plenty of water. After receiving IV contrast, you may experience a mild flushed feeling. This is normal. On occasion, you may experience a mild rash up to 24 hours after the test. This is not dangerous. If this occurs, you can take Benadryl 25 mg and increase your fluid intake. If you experience  trouble breathing, this can be serious. If it is severe call 911 IMMEDIATELY. If it is mild, please call our office.   We will call to schedule your test 2-4 weeks out understanding that some insurance companies will need an authorization prior to the service being performed.   For non-scheduling related questions, please contact the cardiac imaging nurse navigator should you have any questions/concerns: Marchia Bond, Cardiac Imaging Nurse Navigator Gordy Clement, Cardiac Imaging Nurse Navigator [facility] Direct Office Dial: [phone]   For scheduling needs, including cancellations and rescheduling, please call [place], [phone].    Follow-Up: At [facility], you and your health needs are our priority.  As part of our continuing mission to provide you with exceptional heart care, we have created designated Provider Care Teams.  These Care Teams include your primary Cardiologist (physician) and Advanced Practice Providers (APPs -  Physician Assistants and Nurse Practitioners) who all work together to provide you with the care you need, when you need it.  We recommend signing up for the patient portal called "MyChart".  Sign up information is provided on this After Visit Summary.  MyChart is used to connect with patients for Virtual Visits (Telemedicine).  Patients are able to view lab/test results, encounter notes, upcoming appointments, etc.  Non-urgent messages can be sent to your provider as well.   To learn more about what you can do with MyChart, go to [email or web address].    Your next appointment:   6 month(s)  Provider:   Kirk Ruths MD

## 2022-02-28 ENCOUNTER — Telehealth (HOSPITAL_COMMUNITY): Payer: Self-pay | Admitting: *Deleted

## 2022-02-28 NOTE — Progress Notes (Unsigned)
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 We reviewed the society for vascular surgery guidelines today. I counseled him that this does not require treatment unless is grows to >2cm. I will re-image him in 6 months with CT angiogram.   CHIEF COMPLAINT: episodic chest pain  HISTORY OF PRESENT ILLNESS: Brady Newton is a 62 y.o. male referred to clinic for abnormal CT finding during chest pain workup. The patient was found to have an aneurysmal and dissected celiac artery on CT scan of the chest. The patient reports episodic chest pain and points to his mid chest at the sternum. He does not have any kind of abdominal or back discomfort. We spent the bulk of our visit reviewing his images and the SVS guidelines for visceral aneurysm disease.    Past Medical History:  Diagnosis Date   Congenital solitary kidney    Lichen plano-pilaris     Past Surgical History:  Procedure Laterality Date   KNEE ARTHROSCOPY     LIVER BIOPSY      Family History  Problem Relation Age of Onset   Hypertension Mother    Dementia Mother    Hypertension Father     Social History   Socioeconomic History   Marital status: Married    Spouse name: Not on file   Number of children: Not on file   Years of education: Not on file   Highest education level: Not on file  Occupational History   Not on file  Tobacco Use   Smoking status: Never   Smokeless tobacco: Never  Substance and Sexual Activity   Alcohol use: Yes    Comment: socially   Drug use: No   Sexual activity: Not on file  Other Topics Concern   Not on file  Social History Narrative   Not on file   Social Determinants of Health   Financial Resource Strain: Not on file  Food Insecurity: Not on file  Transportation Needs: Not on file  Physical Activity: Not on file  Stress: Not on file   Social Connections: Not on file  Intimate Partner Violence: Not on file    No Known Allergies  Current Outpatient Medications  Medication Sig Dispense Refill   folic acid (FOLVITE) 1 MG tablet Take 1 mg by mouth daily.  11   ibuprofen (ADVIL,MOTRIN) 200 MG tablet Take 400 mg by mouth every 6 (six) hours as needed. pain     methotrexate 2.5 MG tablet Take 15 mg by mouth once a week. Pt takes 6 tabs for 30m dose once weekly on Wednesday.     metoprolol tartrate (LOPRESSOR) 50 MG tablet Take 2 hours prior to CT scan 1 tablet 0   omeprazole (PRILOSEC) 40 MG capsule Take 40 mg by mouth every morning.     traMADol (ULTRAM) 50 MG tablet Take 50 mg by mouth 2 (two) times daily as needed.     No current facility-administered medications for this visit.   Facility-Administered Medications Ordered in Other Visits  Medication Dose Route Frequency Provider Last Rate Last Admin   nitroGLYCERIN (NITROSTAT) 0.4 MG SL tablet             PHYSICAL EXAM Vitals:   03/01/22 1249  BP: 113/79  Pulse: 81  Resp: 20  Temp: 98.6 F (37 C)  SpO2: 98%  Weight: 186 lb (84.4 kg)  Height:  $6' 1"c$  (1.854 m)   Well appearing man No distress Regular rate and rhythm Unlabored breathing Soft, non-tender abdomen  PERTINENT LABORATORY AND RADIOLOGIC DATA  Most recent CBC    Latest Ref Rng & Units 07/01/2011    1:52 PM 07/01/2011    1:22 PM 02/01/2007   10:12 AM  CBC  WBC 4.0 - 10.5 K/uL  4.9  9.3   Hemoglobin 13.0 - 17.0 g/dL 13.9  14.1  14.2   Hematocrit 39.0 - 52.0 % 41.0  41.4  40.5   Platelets 150 - 400 K/uL  206  225      Most recent CMP    Latest Ref Rng & Units 07/01/2011    1:52 PM  CMP  Glucose 70 - 99 mg/dL 92   BUN 6 - 23 mg/dL 11   Creatinine 0.50 - 1.35 mg/dL 1.00   Sodium 135 - 145 mEq/L 138   Potassium 3.5 - 5.1 mEq/L 3.8   Chloride 96 - 112 mEq/L 103    CT Chest personally reviewed Small aneurysm of celiac trunk; 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   Brady Newton. Brady Breed, MD [facility] Phone Number: [phone] 03/01/2022 4:52 PM   Total time spent on preparing this encounter including chart review, data review, collecting history, examining the patient, coordinating care for this new patient, 60 minutes.  Portions of this report may have been transcribed using voice recognition software.  Every effort has been made to ensure accuracy; however, inadvertent computerized transcription errors may still be present.

## 2022-02-28 NOTE — Telephone Encounter (Signed)
Patient returning call regarding upcoming cardiac imaging study; pt verbalizes understanding of appt date/time, parking situation and where to check in, pre-test NPO status and medications ordered, and verified current allergies; name and call back number provided for further questions should they arise  Brady Clement RN Navigator Cardiac Imaging [place] [facility] [phone] office [phone] cell  Patient to take 37m metoprolol tartrate two hours prior to his cardiac CT scan. He is aware to arrive at 3:30pm.

## 2022-02-28 NOTE — Telephone Encounter (Signed)
Attempted to call patient regarding upcoming cardiac CT appointment. °Left message on voicemail with name and callback number ° °Kanasia Gayman RN Navigator Cardiac Imaging °[facility] °[phone] Office °[phone] Cell ° °

## 2022-03-01 ENCOUNTER — Encounter: Payer: Self-pay | Admitting: Vascular Surgery

## 2022-03-01 ENCOUNTER — Ambulatory Visit (HOSPITAL_COMMUNITY)
Admission: RE | Admit: 2022-03-01 | Discharge: 2022-03-01 | Disposition: A | Discharge status: Home / Self Care | Payer: Managed Care, Other (non HMO) | Source: Ambulatory Visit | Attending: Cardiology | Admitting: Cardiology

## 2022-03-01 ENCOUNTER — Ambulatory Visit: Payer: Managed Care, Other (non HMO) | Admitting: Vascular Surgery

## 2022-03-01 VITALS — BP 113/79 | HR 81 | Temp 98.6°F | Resp 20 | Ht 73.0 in | Wt 186.0 lb

## 2022-03-01 DIAGNOSIS — R072 Precordial pain: Secondary | ICD-10-CM | POA: Diagnosis present

## 2022-03-01 DIAGNOSIS — I728 Aneurysm of other specified arteries: Secondary | ICD-10-CM | POA: Diagnosis not present

## 2022-03-01 MED ORDER — IOHEXOL 350 MG/ML SOLN
95.0000 mL | Freq: Once | INTRAVENOUS | Status: AC | PRN
  Administered 2022-03-01: 95 mL via INTRAVENOUS

## 2022-03-01 MED ORDER — NITROGLYCERIN 0.4 MG SL SUBL
SUBLINGUAL_TABLET | SUBLINGUAL | Status: AC
  Filled 2022-03-01: qty 2

## 2022-03-01 MED ORDER — NITROGLYCERIN 0.4 MG SL SUBL
0.8000 mg | SUBLINGUAL_TABLET | Freq: Once | SUBLINGUAL | Status: AC
  Administered 2022-03-01: 0.8 mg via SUBLINGUAL

## 2022-04-25 IMAGING — CR DG LUMBAR SPINE 2-3V
3 series · 3 of 3 positions shown · non-contrast
Comparison: None

CLINICAL DATA: Low back and pelvic pain

EXAM:
PELVIS - 1 VIEW; LUMBAR SPINE - 2-3 VIEW

[t l-spine a.p.]
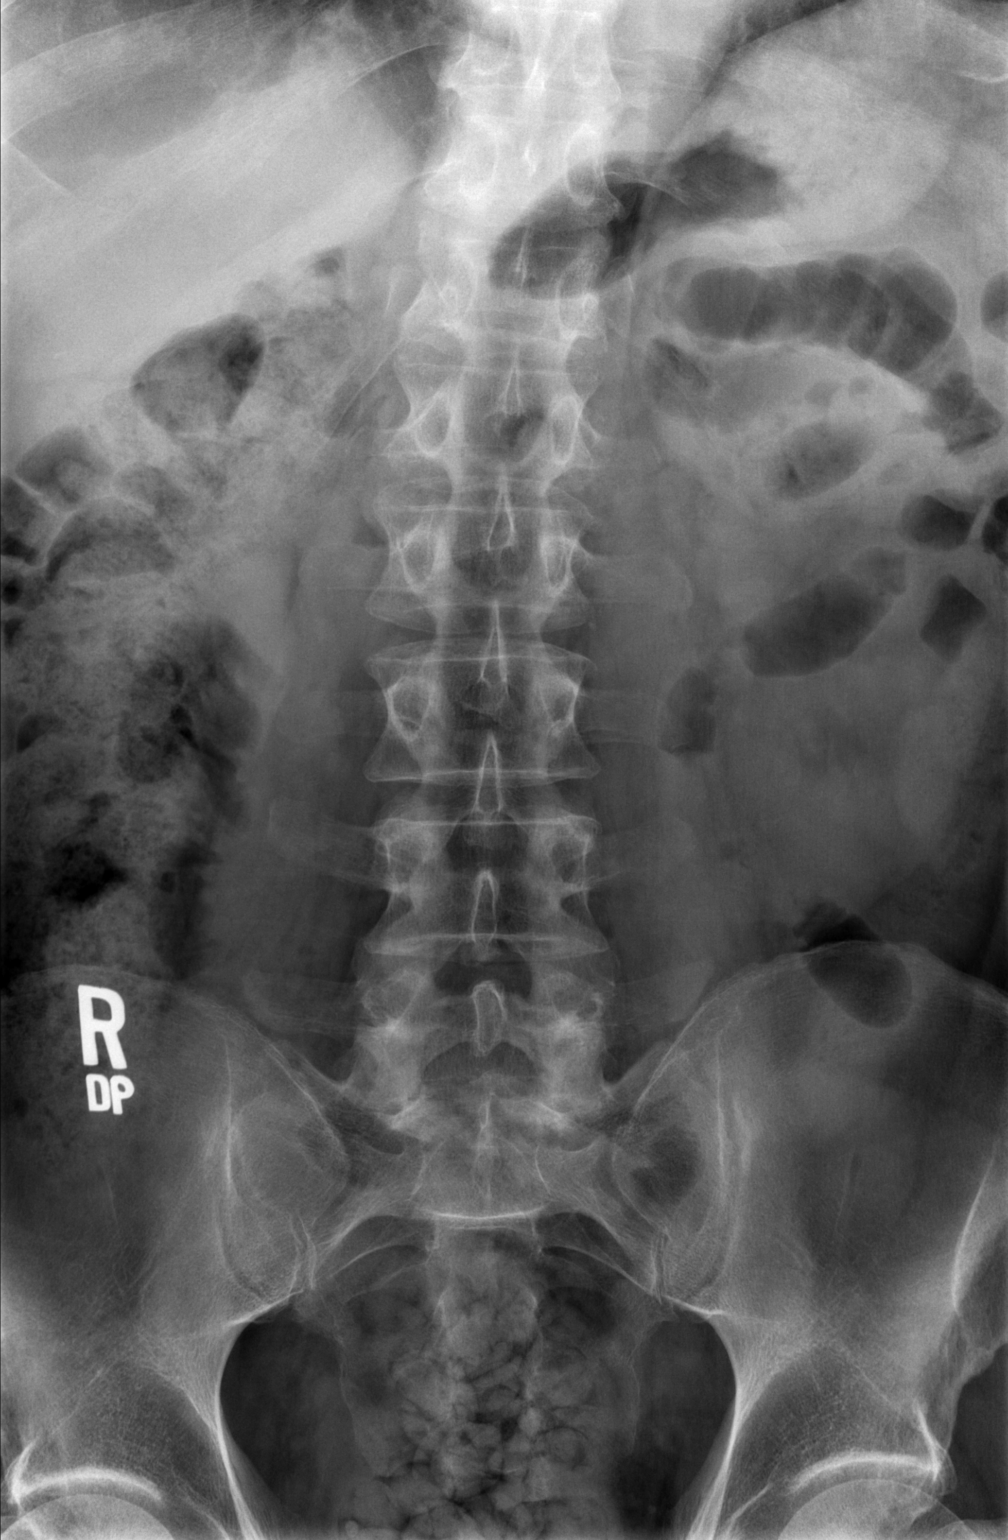

[t l-spine lat]
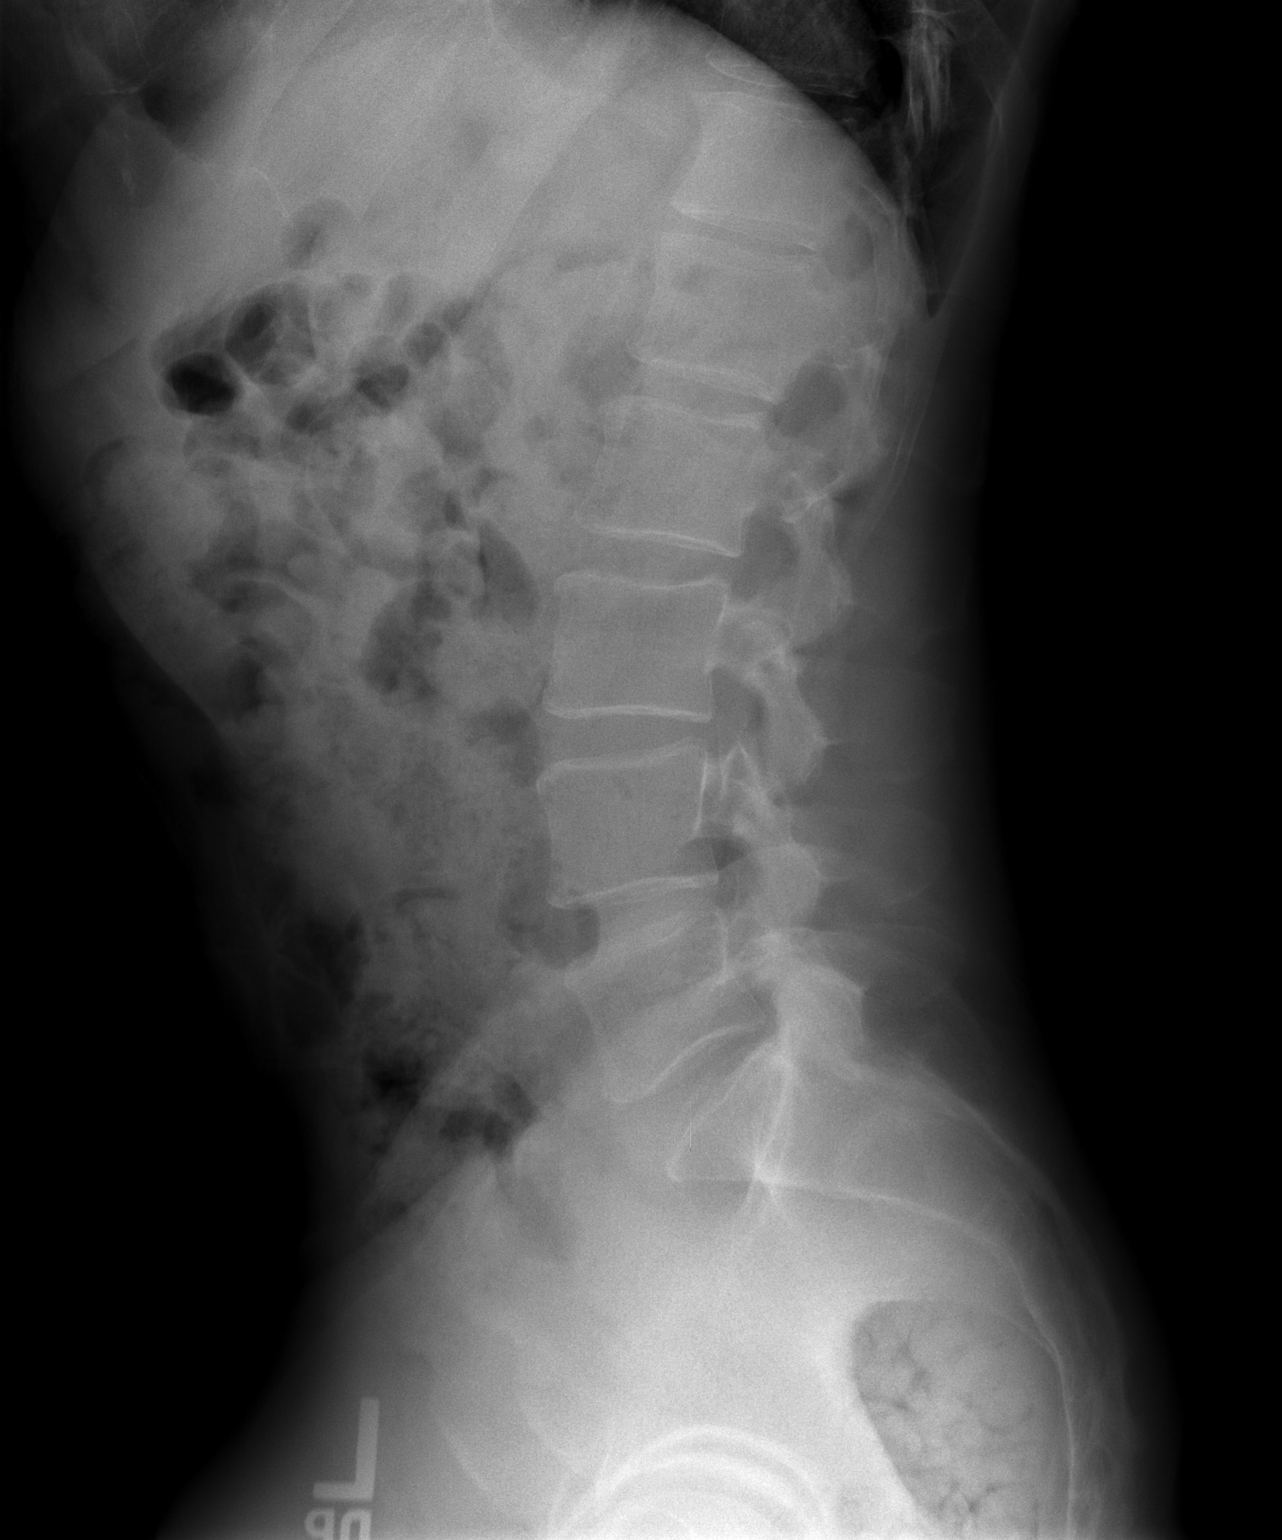

[t l-spine l5-s1 spot]
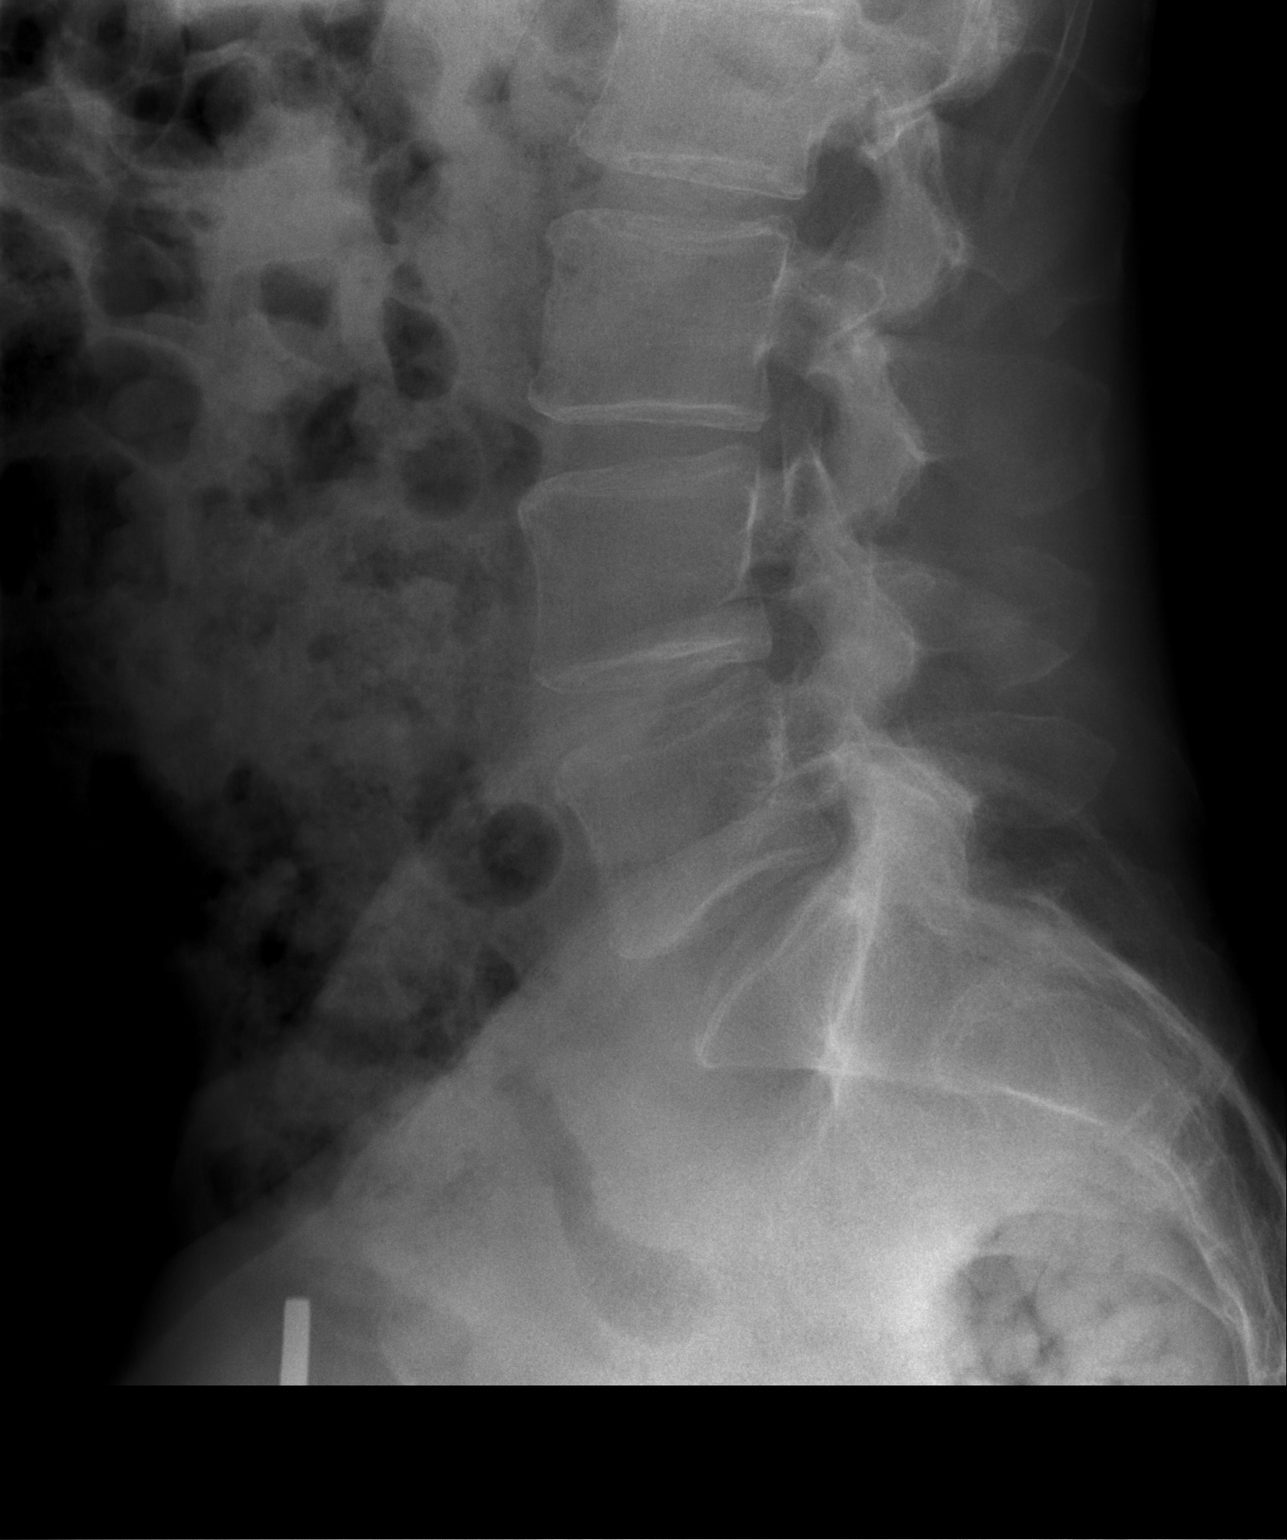

[3 of 3 positions shown; findings below may reference images not displayed]

FINDINGS: There is no evidence of lumbar spine fracture. Alignment is normal.
Intervertebral disc spaces are maintained.

No acute fracture in the pelvis. The joint spaces are preserved.
Sacroiliac joints and pubic symphysis are unremarkable.
IMPRESSION: Negative.

## 2022-04-25 IMAGING — CR DG PELVIS 1-2V
1 series · 1 of 1 positions shown · non-contrast
Comparison: None

CLINICAL DATA: Low back and pelvic pain

EXAM:
PELVIS - 1 VIEW; LUMBAR SPINE - 2-3 VIEW

[t pelvis a.p.]
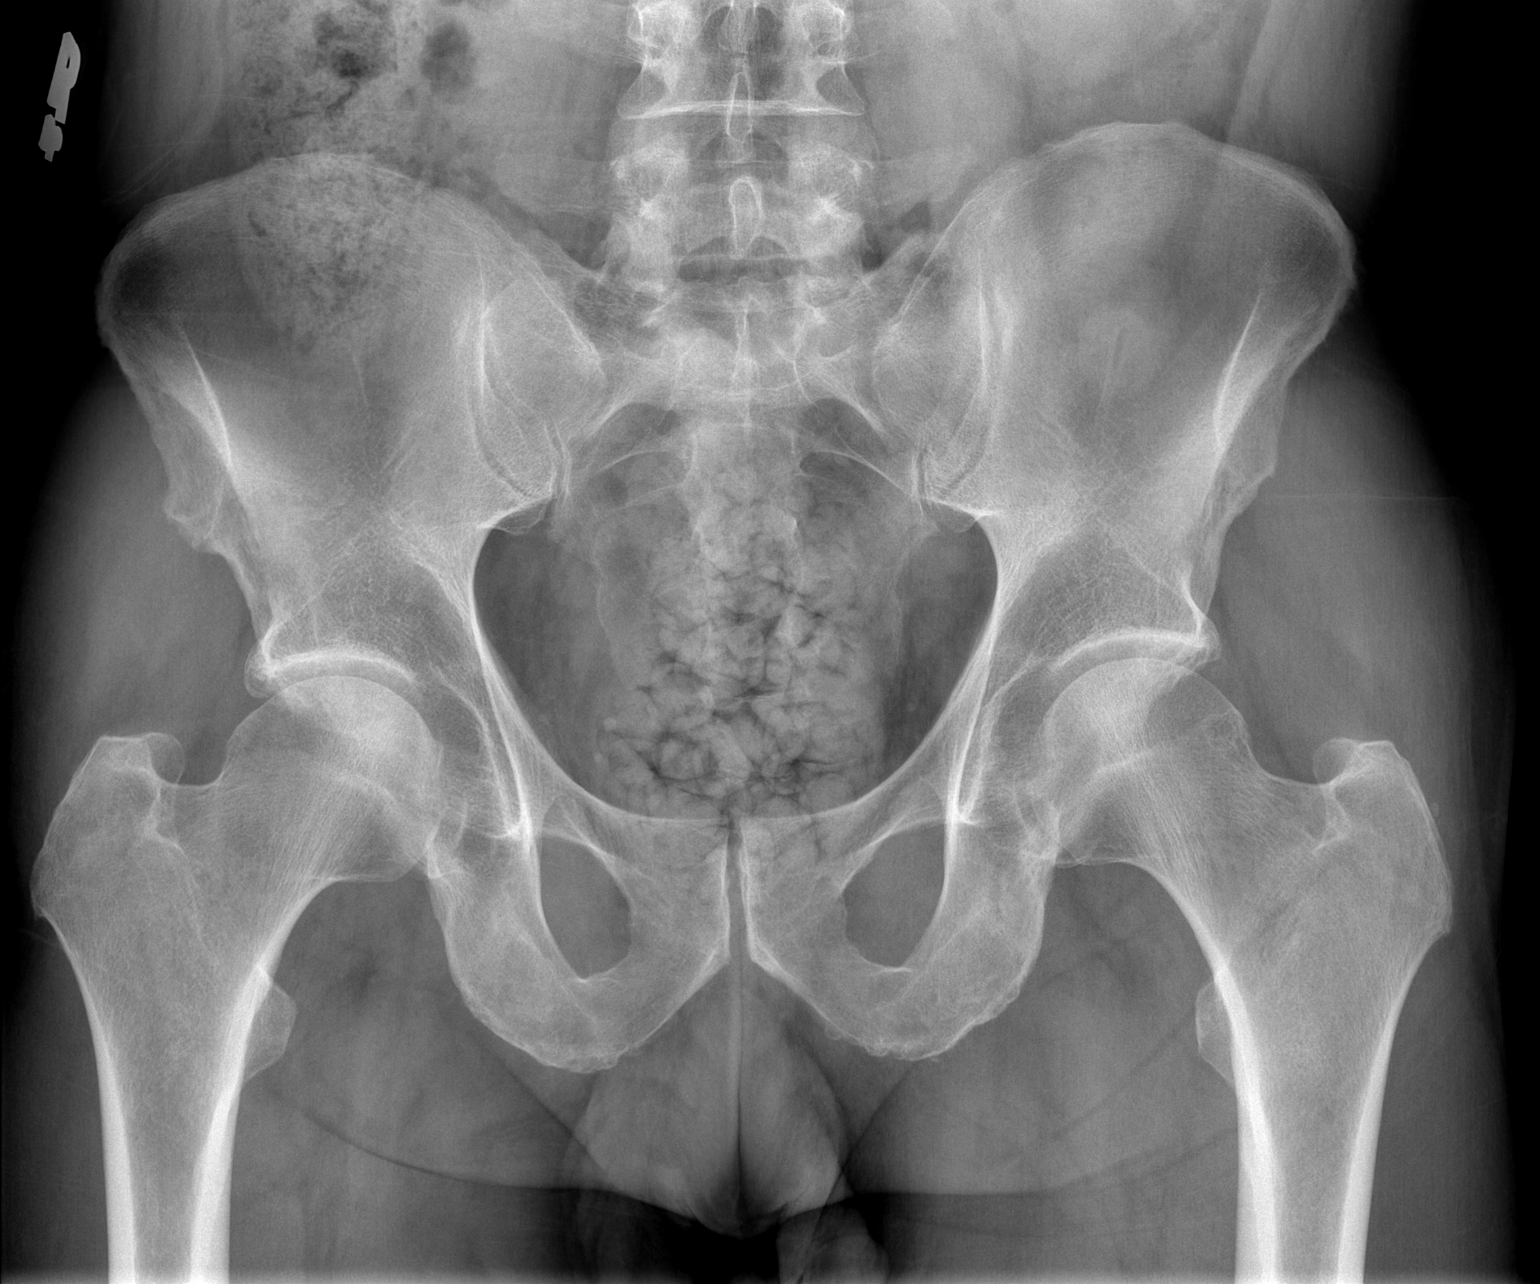

[1 of 1 positions shown; findings below may reference images not displayed]

FINDINGS: There is no evidence of lumbar spine fracture. Alignment is normal.
Intervertebral disc spaces are maintained.

No acute fracture in the pelvis. The joint spaces are preserved.
Sacroiliac joints and pubic symphysis are unremarkable.
IMPRESSION: Negative.

## 2022-07-06 ENCOUNTER — Ambulatory Visit
Admission: RE | Admit: 2022-07-06 | Discharge: 2022-07-06 | Disposition: A | Discharge status: Home / Self Care | Payer: Managed Care, Other (non HMO) | Source: Ambulatory Visit | Attending: Internal Medicine | Admitting: Internal Medicine

## 2022-08-09 ENCOUNTER — Other Ambulatory Visit: Payer: Self-pay

## 2022-08-09 DIAGNOSIS — I728 Aneurysm of other specified arteries: Secondary | ICD-10-CM

## 2022-08-10 NOTE — Progress Notes (Deleted)
HPI: FU chest pain. Patient apparently has a long history of chest wall pain. He was seen at [facility] for this in the past. He was treated for costochondritis but his symptoms worsened. Patient apparently was referred to cardiothoracic surgery but not felt to have a surgical issue. 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 Patient states that beginning April 2023 he has had intermittent chest pain. His initial symptoms began after his wife landed on top of him. He had pain for approximately 3 to 4 days in the substernal area without radiation or associated symptoms.  Coronary CTA February 2024 showed calcium score 0 and no coronary disease.  Since last seen,   Current Outpatient Medications  Medication Sig Dispense Refill   folic acid (FOLVITE) 1 MG tablet Take 1 mg by mouth daily.  11   ibuprofen (ADVIL,MOTRIN) 200 MG tablet Take 400 mg by mouth every 6 (six) hours as needed. pain     methotrexate 2.5 MG tablet Take 15 mg by mouth once a week. Pt takes 6 tabs for 15mg  dose once weekly on Wednesday.     metoprolol tartrate (LOPRESSOR) 50 MG tablet Take 2 hours prior to CT scan 1 tablet 0   omeprazole (PRILOSEC) 40 MG capsule Take 40 mg by mouth every morning.     traMADol (ULTRAM) 50 MG tablet Take 50 mg by mouth 2 (two) times daily as needed.     No current facility-administered medications for this visit.     Past Medical History:  Diagnosis Date   Congenital solitary kidney    Lichen plano-pilaris     Past Surgical History:  Procedure Laterality Date   KNEE ARTHROSCOPY      LIVER BIOPSY      Social History   Socioeconomic History   Marital status: Married    Spouse name: Not on file   Number of children: Not on file   Years of education: Not on file   Highest education level: Not on file  Occupational History   Not on file  Tobacco Use   Smoking status: Never   Smokeless tobacco: Never  Substance and Sexual Activity   Alcohol use: Yes    Comment: socially   Drug use: No   Sexual activity: Not on file  Other Topics Concern   Not on file  Social History Narrative   Not on file   Social Determinants of Health   Financial Resource Strain: Not on file  Food Insecurity: Not on file  Transportation Needs: Not on file  Physical Activity: Not on file  Stress: Not on file  Social Connections: Not on file  Intimate Partner Violence: Not on file    Family History  Problem Relation Age of Onset   Hypertension Mother    Dementia Mother    Hypertension Father     ROS: no fevers or chills, productive cough, hemoptysis, dysphasia, odynophagia, melena, hematochezia, dysuria, hematuria, rash, seizure activity, orthopnea, PND, pedal edema, claudication. Remaining systems are negative.  Physical Exam: Well-developed well-nourished in no acute distress.  Skin is warm and dry.  HEENT is normal.  Neck is supple.  Chest is clear to auscultation with normal expansion.  Cardiovascular exam is regular rate and rhythm.  Abdominal exam nontender or distended. No masses palpated. Extremities show no edema. neuro grossly intact  ECG- personally reviewed  A/P  1 chest pain-as outlined previously symptoms are atypical.  His pain is felt likely to be musculoskeletal.  Follow-up coronary CTA showed no coronary disease.  2 history of lung nodule-I again asked the patient to follow-up with primary care for this issue.  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  Olga Millers, MD

## 2022-08-15 NOTE — Addendum Note (Signed)
Addended by: Leilani Able, Xyler Terpening A on: 08/15/2022 04:59 PM   Modules accepted: Orders

## 2022-08-23 ENCOUNTER — Ambulatory Visit: Payer: Managed Care, Other (non HMO) | Admitting: Cardiology

## 2022-08-29 NOTE — Progress Notes (Signed)
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  Other history includes solitary left kidney with a large cyst, pulmonary nodules on chest CT.  Last seen by Dr. Jens Newton on 02/21/2022.  At that office visit the patient was complaining of chest pain which Dr. Jens Newton felt was atypical and likely musculoskeletal.  To be thorough Dr. Jens Newton did arrange a cardiac CTA for more diagnostic prognostic purposes.  Coronary calcium score was 0.  Consideration for nonatherosclerotic causes of chest pain were recommended.  Past Medical History    Past Medical History:  Diagnosis Date   Congenital solitary kidney    Lichen plano-pilaris    Past Surgical History:  Procedure Laterality Date   KNEE ARTHROSCOPY     LIVER BIOPSY      Allergies  No Known Allergies  History of Present Illness    Brady Newton is here for ongoing assessment and management of chronic chest pain, atypical for cardiac etiology with normal coronary CTA, calcium score of 0, January 2024.  He comes today having just gotten over COVID.  He states it lasted for approximately 2 weeks got up from someone at work.  It is been now another 10 days since he has recovered.  He has no other sequela.  He denies any recurrent chest pain, worsening shortness of breath or lingering shortness of breath, dizziness, or fatigue.  Home Medications    Current Outpatient Medications   Medication Sig Dispense Refill   folic acid (FOLVITE) 1 MG tablet Take 1 mg by mouth daily.  11   ibuprofen (ADVIL,MOTRIN) 200 MG tablet Take 400 mg by mouth every 6 (six) hours as needed. pain     metoprolol tartrate (LOPRESSOR) 50 MG tablet Take 2 hours prior to CT scan (Patient not taking: Reported on 09/02/2022) 1 tablet 0   No current facility-administered medications for this visit.     Family History    Family History  Problem Relation Age of Onset   Hypertension Mother    Dementia Mother    Hypertension Father    He indicated that his mother is deceased. He indicated that his father is deceased.  Social History    Social History   Socioeconomic History   Marital status: Married    Spouse name: Not on file   Number of children: Not on file   Years of education: Not on file   Highest education level: Not on file  Occupational History   Not on file  Tobacco Use   Smoking status: Never   Smokeless tobacco: Never  Substance and Sexual Activity   Alcohol use: Yes    Comment: socially   Drug use: No   Sexual activity: Not on file  Other Topics Concern   Not on file  Social History Narrative   Not on file   Social Determinants of Health   Financial Resource Strain: Not on file  Food Insecurity: Not on file  Transportation Needs: Not on file  Physical Activity: Not on  file  Stress: Not on file  Social Connections: Not on file  Intimate Partner Violence: Not on file     Review of Systems    General:  No chills, fever, night sweats or weight changes.  Cardiovascular:  No chest pain, dyspnea on exertion, edema, orthopnea, palpitations, paroxysmal nocturnal dyspnea. Dermatological: No rash, lesions/masses Respiratory: No cough, dyspnea Urologic: No hematuria, dysuria Abdominal:   No nausea, vomiting, diarrhea, bright red blood per rectum, melena, or hematemesis Neurologic:  No visual changes, wkns, changes in mental status. All other systems reviewed and are  otherwise negative except as noted above.       Physical Exam    VS:  BP 102/64   Pulse (!) 58   Ht 6\' 1"  (1.854 m)   Wt 187 lb (84.8 kg)   SpO2 98%   BMI 24.67 kg/m  , BMI Body mass index is 24.67 kg/m.     GEN: Well nourished, well developed, in no acute distress. HEENT: normal. Neck: Supple, no JVD, carotid bruits, or masses. Cardiac: RRR, no murmurs, rubs, or gallops. No clubbing, cyanosis, edema.  Radials/DP/PT 2+ and equal bilaterally.  Respiratory:  Respirations regular and unlabored, clear to auscultation bilaterally. GI: Soft, nontender, nondistended, BS + x 4. MS: no deformity or atrophy. Skin: warm and dry, no rash. Neuro:  Strength and sensation are intact. Psych: Normal affect.      Lab Results  Component Value Date   WBC 4.9 07/01/2011   HGB 13.9 07/01/2011   HCT 41.0 07/01/2011   MCV 84.5 07/01/2011   PLT 206 07/01/2011   Lab Results  Component Value Date   CREATININE 1.00 07/01/2011   BUN 11 07/01/2011   NA 138 07/01/2011   K 3.8 07/01/2011   CL 103 07/01/2011   No results found for: "ALT", "AST", "GGT", "ALKPHOS", "BILITOT" No results found for: "CHOL", "HDL", "LDLCALC", "LDLDIRECT", "TRIG", "CHOLHDL"  No results found for: "HGBA1C"   Review of Prior Studies Chest CT 03/01/2022 Noncardiac portion: IMPRESSION: Clustered peribronchovascular nodularity in the right middle lobe and lingula, likely infectious or postinfectious in etiology. Coronary CTA 03/01/2022  Aorta: Normal size.  No calcifications.  No dissection.   Aortic Valve:  Trileaflet.  No calcifications.   Coronary Arteries:  Normal coronary origin.  Right dominance.   RCA is a large dominant artery that gives rise to PDA and PLA. There is no plaque.   Left main is a large artery that gives rise to LAD and LCX arteries.   LAD is a large vessel that has no plaque.   LCX is a non-dominant artery that gives rise to one large OM1 branch. There is no plaque.   Coronary Calcium  Score:   Left main: 0   Left anterior descending artery: 0   Left circumflex artery: 0   Right coronary artery: 0   Total: 0   Percentile: 0   Other findings:   Normal pulmonary vein drainage into the left atrium.   Normal left atrial appendage without a thrombus.   Normal size of the pulmonary artery.   IMPRESSION: 1. Coronary calcium score of 0. This was 0 percentile for age and sex matched control.   2. Normal coronary origin with right dominance.   3. CAD-RADS 0. No evidence of CAD (0%). Consider non-atherosclerotic causes of chest pain.   The noncardiac portion of this study will be interpreted in separate report by the radiologist.      Assessment & Plan   1.  Non-cardiac chest discomfort: Cardiac CTA January 2024 did not show any calcium or coronary artery disease.  Continue  current primary prevention with weight management, exercise, and monitoring of lipid status.  2.  PAD: Per CT scan the patient showed signs of associated dissection flap that extends to the origin of the common hepatic artery Lymph gastric and splenic arteries no evidence of rupture.   He is being followed by vein and vascular with watchful waiting.  3.  Recent COVID infection:  He has recovered for 10 days.  Did not take any antivirals at the time.  He is not having any sequela post COVID symptoms at this time.       Signed, Bettey Mare. Liborio Nixon, ANP, AACC   09/02/2022 10:47 AM      Office [phone] Fax [phone]  Notice: This dictation was prepared with Dragon dictation along with smaller phrase technology. Any transcriptional errors that result from this process are unintentional and may not be corrected upon review.

## 2022-08-30 ENCOUNTER — Ambulatory Visit
Admission: RE | Admit: 2022-08-30 | Discharge: 2022-08-30 | Disposition: A | Discharge status: Home / Self Care | Payer: Managed Care, Other (non HMO) | Source: Ambulatory Visit | Attending: Vascular Surgery

## 2022-08-30 DIAGNOSIS — I728 Aneurysm of other specified arteries: Secondary | ICD-10-CM

## 2022-08-30 MED ORDER — IOPAMIDOL (ISOVUE-370) INJECTION 76%
75.0000 mL | Freq: Once | INTRAVENOUS | Status: AC | PRN
  Administered 2022-08-30: 75 mL via INTRAVENOUS

## 2022-09-02 ENCOUNTER — Ambulatory Visit: Payer: Managed Care, Other (non HMO) | Attending: Cardiology | Admitting: Adult Health

## 2022-09-02 ENCOUNTER — Encounter: Payer: Self-pay | Admitting: Adult Health

## 2022-09-02 VITALS — BP 102/64 | HR 58 | Ht 73.0 in | Wt 187.0 lb

## 2022-09-02 DIAGNOSIS — R072 Precordial pain: Secondary | ICD-10-CM | POA: Diagnosis not present

## 2022-09-02 DIAGNOSIS — I739 Peripheral vascular disease, unspecified: Secondary | ICD-10-CM | POA: Diagnosis not present

## 2022-09-02 DIAGNOSIS — U099 Post covid-19 condition, unspecified: Secondary | ICD-10-CM | POA: Diagnosis not present

## 2022-09-02 NOTE — Patient Instructions (Signed)
Medication Instructions:  No Changes *If you need a refill on your cardiac medications before your next appointment, please call your pharmacy*   Lab Work: No Labs If you have labs (blood work) drawn today and your tests are completely normal, you will receive your results only by: MyChart Message (if you have MyChart) OR A paper copy in the mail If you have any lab test that is abnormal or we need to change your treatment, we will call you to review the results.   Testing/Procedures: No Testing   Follow-Up: At [facility], you and your health needs are our priority.  As part of our continuing mission to provide you with exceptional heart care, we have created designated Provider Care Teams.  These Care Teams include your primary Cardiologist (physician) and Advanced Practice Providers (APPs -  Physician Assistants and Nurse Practitioners) who all work together to provide you with the care you need, when you need it.  We recommend signing up for the patient portal called "MyChart".  Sign up information is provided on this After Visit Summary.  MyChart is used to connect with patients for Virtual Visits (Telemedicine).  Patients are able to view lab/test results, encounter notes, upcoming appointments, etc.  Non-urgent messages can be sent to your provider as well.   To learn more about what you can do with MyChart, go to [email or web address].    Your next appointment:   1 year(s)  Provider:   Brian Crenshaw, MD   

## 2022-09-06 ENCOUNTER — Ambulatory Visit: Payer: Managed Care, Other (non HMO) | Admitting: Vascular Surgery

## 2022-10-10 NOTE — Progress Notes (Unsigned)
[facility] [place]  ASSESSMENT / PLAN: 62 y.o. 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 We reviewed the society for vascular surgery guidelines today. I counseled him that this does not require treatment unless is grows to >2cm. I will re-image him in 6 months with CT angiogram.   CHIEF COMPLAINT: episodic chest pain  HISTORY OF PRESENT ILLNESS: Brady Newton is a 62 y.o. male referred to clinic for abnormal CT finding during chest pain workup. The patient was found to have an aneurysmal and dissected celiac artery on CT scan of the chest. The patient reports episodic chest pain and points to his mid chest at the sternum. He does not have any kind of abdominal or back discomfort. We spent the bulk of our visit reviewing his images and the SVS guidelines for visceral aneurysm disease.   10/11/22: Patient returns to clinic for repeat evaluation and to review his most recent CT angiogram.  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  His celiac artery appears fairly similar.   Past Medical History:  Diagnosis Date   Congenital solitary kidney    Lichen plano-pilaris    Osteoporosis     Past Surgical History:  Procedure Laterality Date   KNEE ARTHROSCOPY     LIVER BIOPSY      Family History  Problem Relation Age of Onset   Hypertension Mother    Dementia Mother    Hypertension Father     Social History   Socioeconomic History   Marital status: Married    Spouse name: Not on file   Number of children: Not on file   Years of education: Not on file   Highest education level: Not on file  Occupational History   Not on file  Tobacco Use   Smoking status: Never   Smokeless tobacco: Never  Substance and Sexual Activity   Alcohol use: Yes    Comment: socially   Drug use: No   Sexual activity: Not  on file  Other Topics Concern   Not on file  Social History Narrative   Not on file   Social Determinants of Health   Financial Resource Strain: Not on file  Food Insecurity: Not on file  Transportation Needs: Not on file  Physical Activity: Not on file  Stress: Not on file  Social Connections: Not on file  Intimate Partner Violence: Not on file    No Known Allergies  Current Outpatient Medications  Medication Sig Dispense Refill   folic acid (FOLVITE) 1 MG tablet Take 1 mg by mouth daily.  11   ibuprofen (ADVIL,MOTRIN) 200 MG tablet Take 400 mg by mouth every 6 (six) hours as needed. pain     metoprolol tartrate (LOPRESSOR) 50 MG tablet Take 2 hours prior to CT scan (Patient not taking: Reported on 09/02/2022) 1 tablet 0   No current facility-administered medications for this visit.    PHYSICAL EXAM There were no vitals filed for this visit.  Well appearing man No distress Regular rate and rhythm Unlabored breathing Soft, non-tender abdomen  PERTINENT LABORATORY AND RADIOLOGIC DATA  Most recent CBC    Latest Ref Rng & Units 07/01/2011    1:52 PM 07/01/2011    1:22 PM 02/01/2007   10:12 AM  CBC  WBC 4.0 - 10.5 K/uL  4.9  9.3   Hemoglobin 13.0 - 17.0  g/dL 16.1  09.6  04.5   Hematocrit 39.0 - 52.0 % 41.0  41.4  40.5   Platelets 150 - 400 K/uL  206  225      Most recent CMP    Latest Ref Rng & Units 07/01/2011    1:52 PM  CMP  Glucose 70 - 99 mg/dL 92   BUN 6 - 23 mg/dL 11   Creatinine 4.09 - 1.35 mg/dL 8.11   Sodium 914 - 782 mEq/L 138   Potassium 3.5 - 5.1 mEq/L 3.8   Chloride 96 - 112 mEq/L 103    CT angiogram of chest, abdomen, and pelvis personally reviewed Similar appearance in the celiac trunk.  There is a dissecting segment extending into the splenic artery which is not flow-limiting.  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 Lenell Antu, MD FACS [facility] Phone Number: [phone] 10/10/2022 9:23 PM   Total time spent on preparing this encounter including chart review, data review, collecting history, examining the patient, coordinating care for this established patient, 20 minutes Portions of this report may have been transcribed using voice recognition software.  Every effort has been made to ensure accuracy; however, inadvertent computerized transcription errors may still be present.

## 2022-10-11 ENCOUNTER — Ambulatory Visit: Payer: Managed Care, Other (non HMO) | Admitting: Vascular Surgery

## 2022-10-11 ENCOUNTER — Encounter: Payer: Self-pay | Admitting: Vascular Surgery

## 2022-10-11 VITALS — BP 108/76 | HR 68 | Temp 98.6°F | Resp 20 | Ht 73.0 in | Wt 190.0 lb

## 2022-10-11 DIAGNOSIS — I728 Aneurysm of other specified arteries: Secondary | ICD-10-CM | POA: Diagnosis not present

## 2022-10-11 DIAGNOSIS — I723 Aneurysm of iliac artery: Secondary | ICD-10-CM | POA: Diagnosis not present

## 2022-10-21 ENCOUNTER — Other Ambulatory Visit: Payer: Self-pay

## 2022-10-21 DIAGNOSIS — I723 Aneurysm of iliac artery: Secondary | ICD-10-CM

## 2022-10-21 DIAGNOSIS — I728 Aneurysm of other specified arteries: Secondary | ICD-10-CM

## 2023-10-26 ENCOUNTER — Other Ambulatory Visit: Payer: Self-pay

## 2023-10-26 DIAGNOSIS — I723 Aneurysm of iliac artery: Secondary | ICD-10-CM

## 2023-10-26 DIAGNOSIS — I728 Aneurysm of other specified arteries: Secondary | ICD-10-CM

## 2023-11-20 NOTE — Progress Notes (Unsigned)
 [facility] [place]  ASSESSMENT / PLAN: 63 y.o. 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 We reviewed the society for vascular surgery guidelines today. I counseled him that this does not require treatment unless is grows to >2cm. I will re-image him in 6 months with CT angiogram.   CHIEF COMPLAINT: episodic chest pain  HISTORY OF PRESENT ILLNESS: Brady Newton is a 63 y.o. male referred to clinic for abnormal CT finding during chest pain workup. The patient was found to have an aneurysmal and dissected celiac artery on CT scan of the chest. The patient reports episodic chest pain and points to his mid chest at the sternum. He does not have any kind of abdominal or back discomfort. We spent the bulk of our visit reviewing his images and the SVS guidelines for visceral aneurysm disease.   10/11/22: Patient returns to clinic for repeat evaluation and to review his most recent CT angiogram.  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  His celiac artery appears fairly similar.   Past Medical History:  Diagnosis Date   Congenital solitary kidney    Lichen plano-pilaris    Osteoporosis     Past Surgical History:  Procedure Laterality Date   KNEE ARTHROSCOPY     LIVER BIOPSY      Family History  Problem Relation Age of Onset   Hypertension Mother    Dementia Mother    Hypertension Father     Social History   Socioeconomic History   Marital status: Married    Spouse name: Not on file   Number of children: Not on file   Years of education: Not on file   Highest education level: Not on file  Occupational History   Not on file  Tobacco Use   Smoking status: Never   Smokeless tobacco: Never  Substance and Sexual Activity   Alcohol use: Yes    Comment: socially   Drug use: No   Sexual activity: Not  on file  Other Topics Concern   Not on file  Social History Narrative   Not on file   Social Drivers of Health   Financial Resource Strain: Not on file  Food Insecurity: Not on file  Transportation Needs: Not on file  Physical Activity: Not on file  Stress: Not on file  Social Connections: Not on file  Intimate Partner Violence: Not on file    No Known Allergies  Current Outpatient Medications  Medication Sig Dispense Refill   folic acid (FOLVITE) 1 MG tablet Take 1 mg by mouth daily.  11   ibuprofen (ADVIL,MOTRIN) 200 MG tablet Take 400 mg by mouth every 6 (six) hours as needed. pain     No current facility-administered medications for this visit.    PHYSICAL EXAM There were no vitals filed for this visit.  Well appearing man No distress Regular rate and rhythm Unlabored breathing Soft, non-tender abdomen  PERTINENT LABORATORY AND RADIOLOGIC DATA  Most recent CBC    Latest Ref Rng & Units 07/01/2011    1:52 PM 07/01/2011    1:22 PM 02/01/2007   10:12 AM  CBC  WBC 4.0 - 10.5 K/uL  4.9  9.3   Hemoglobin 13.0 - 17.0 g/dL 86.0  85.8  85.7   Hematocrit 39.0 - 52.0 % 41.0  41.4  40.5   Platelets 150 - 400  K/uL  206  225      Most recent CMP    Latest Ref Rng & Units 07/01/2011    1:52 PM  CMP  Glucose 70 - 99 mg/dL 92   BUN 6 - 23 mg/dL 11   Creatinine 9.49 - 1.35 mg/dL 8.99   Sodium 864 - 854 mEq/L 138   Potassium 3.5 - 5.1 mEq/L 3.8   Chloride 96 - 112 mEq/L 103    CT angiogram of chest, abdomen, and pelvis personally reviewed Similar appearance in the celiac trunk.  There is a dissecting segment extending into the splenic artery which is not flow-limiting.  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 Magda, MD FACS [facility] Phone  Number: [phone] 11/20/2023 4:49 PM   Total time spent on preparing this encounter including chart review, data review, collecting history, examining the patient, coordinating care for this established patient, 20 minutes Portions of this report may have been transcribed using voice recognition software.  Every effort has been made to ensure accuracy; however, inadvertent computerized transcription errors may still be present.

## 2023-11-21 ENCOUNTER — Ambulatory Visit (HOSPITAL_BASED_OUTPATIENT_CLINIC_OR_DEPARTMENT_OTHER)
Admission: RE | Admit: 2023-11-21 | Discharge: 2023-11-21 | Disposition: A | Discharge status: Home / Self Care | Payer: Self-pay | Source: Ambulatory Visit | Attending: Vascular Surgery | Admitting: Vascular Surgery

## 2023-11-21 ENCOUNTER — Ambulatory Visit (INDEPENDENT_AMBULATORY_CARE_PROVIDER_SITE_OTHER): Payer: Self-pay | Admitting: Vascular Surgery

## 2023-11-21 ENCOUNTER — Encounter: Payer: Self-pay | Admitting: Vascular Surgery

## 2023-11-21 ENCOUNTER — Ambulatory Visit (HOSPITAL_COMMUNITY)
Admission: RE | Admit: 2023-11-21 | Discharge: 2023-11-21 | Disposition: A | Discharge status: Home / Self Care | Payer: Self-pay | Source: Ambulatory Visit | Attending: Vascular Surgery | Admitting: Vascular Surgery

## 2023-11-21 VITALS — BP 119/81 | HR 52 | Ht 73.0 in | Wt 191.0 lb

## 2023-11-21 DIAGNOSIS — I723 Aneurysm of iliac artery: Secondary | ICD-10-CM | POA: Insufficient documentation

## 2023-11-21 DIAGNOSIS — I728 Aneurysm of other specified arteries: Secondary | ICD-10-CM | POA: Insufficient documentation
